# Patient Record
Sex: Female | Born: 1960
Health system: Southern US, Community
[De-identification: ages and names within clinical notes are randomized; demographics above are authoritative.]

## PROBLEM LIST (undated history)

## (undated) DIAGNOSIS — N840 Polyp of corpus uteri: Secondary | ICD-10-CM

## (undated) DIAGNOSIS — C439 Malignant melanoma of skin, unspecified: Secondary | ICD-10-CM

## (undated) DIAGNOSIS — G039 Meningitis, unspecified: Secondary | ICD-10-CM

## (undated) DIAGNOSIS — R7303 Prediabetes: Secondary | ICD-10-CM

## (undated) DIAGNOSIS — M1990 Unspecified osteoarthritis, unspecified site: Secondary | ICD-10-CM

## (undated) DIAGNOSIS — K219 Gastro-esophageal reflux disease without esophagitis: Secondary | ICD-10-CM

## (undated) DIAGNOSIS — G4730 Sleep apnea, unspecified: Secondary | ICD-10-CM

## (undated) HISTORY — DX: Unspecified osteoarthritis, unspecified site: M19.90

## (undated) HISTORY — PX: CHOLECYSTECTOMY: SHX55

## (undated) HISTORY — PX: OTHER SURGICAL HISTORY: SHX169

## (undated) HISTORY — PX: TONSILLECTOMY: SUR1361

## (undated) HISTORY — PX: DILATION AND CURETTAGE OF UTERUS: SHX78

## (undated) HISTORY — DX: Meningitis, unspecified: G03.9

## (undated) HISTORY — DX: Malignant melanoma of skin, unspecified: C43.9

---
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
## 1996-01-16 DIAGNOSIS — C439 Malignant melanoma of skin, unspecified: Secondary | ICD-10-CM

## 1996-01-16 HISTORY — DX: Malignant melanoma of skin, unspecified: C43.9

## 2002-02-25 DIAGNOSIS — Z85820 Personal history of malignant melanoma of skin: Secondary | ICD-10-CM | POA: Insufficient documentation

## 2011-06-08 ENCOUNTER — Emergency Department (HOSPITAL_COMMUNITY)
Admission: EM | Admit: 2011-06-08 | Discharge: 2011-06-08 | Disposition: A | Discharge status: Home / Self Care | Payer: PRIVATE HEALTH INSURANCE | Attending: Emergency Medicine | Admitting: Emergency Medicine

## 2011-06-08 ENCOUNTER — Encounter (HOSPITAL_COMMUNITY): Payer: Self-pay

## 2011-06-08 ENCOUNTER — Encounter (HOSPITAL_COMMUNITY): Payer: Self-pay | Admitting: Physical Medicine and Rehabilitation

## 2011-06-08 ENCOUNTER — Emergency Department (HOSPITAL_COMMUNITY)
Admission: EM | Admit: 2011-06-08 | Discharge: 2011-06-08 | Disposition: A | Discharge status: Other Acute Inpatient Hospital | Payer: PRIVATE HEALTH INSURANCE | Source: Home / Self Care | Attending: Family Medicine | Admitting: Family Medicine

## 2011-06-08 DIAGNOSIS — H43399 Other vitreous opacities, unspecified eye: Secondary | ICD-10-CM

## 2011-06-08 DIAGNOSIS — H43819 Vitreous degeneration, unspecified eye: Secondary | ICD-10-CM | POA: Insufficient documentation

## 2011-06-08 DIAGNOSIS — K219 Gastro-esophageal reflux disease without esophagitis: Secondary | ICD-10-CM | POA: Insufficient documentation

## 2011-06-08 DIAGNOSIS — H43812 Vitreous degeneration, left eye: Secondary | ICD-10-CM

## 2011-06-08 DIAGNOSIS — H43392 Other vitreous opacities, left eye: Secondary | ICD-10-CM

## 2011-06-08 HISTORY — DX: Gastro-esophageal reflux disease without esophagitis: K21.9

## 2011-06-08 MED ORDER — TETRACAINE HCL 0.5 % OP SOLN
2.0000 [drp] | Freq: Once | OPHTHALMIC | Status: AC
  Administered 2011-06-08: 2 [drp] via OPHTHALMIC

## 2011-06-08 MED ORDER — TETRACAINE HCL 0.5 % OP SOLN
OPHTHALMIC | Status: AC
  Filled 2011-06-08: qty 2

## 2011-06-08 NOTE — ED Notes (Signed)
Family at bedside. 

## 2011-06-08 NOTE — ED Notes (Signed)
MD at bedside. 

## 2011-06-08 NOTE — ED Notes (Signed)
Pt & family informed of plan of care, pt offered meal & drinks while waiting for attending, will continue to monitor

## 2011-06-08 NOTE — ED Provider Notes (Signed)
History     CSN: [id number]  Arrival date & time 06/08/11  1637   First MD Initiated Contact with Patient 06/08/11 1704      Chief Complaint  Patient presents with  . Eye Problem    (Consider location/radiation/quality/duration/timing/severity/associated sxs/prior treatment) HPI Comments: 51 y/o non diabetic female here c/o left eye scotomas and flash lights for 2 days. Worsening symptoms today, denies eye pain. Had a fall with head injury 3 days before eye symptoms. Patient has been evaluated for same complaints  At ED in [place], [place] and as per her reports had normal head CT. Denies dizziness, headache, balance problems, weakness or numbness. No nausea or vomiting.     Past Medical History  Diagnosis Date  . GERD (gastroesophageal reflux disease)     Past Surgical History  Procedure Date  . Tonsillectomy     History reviewed. No pertinent family history.  History  Substance Use Topics  . Smoking status: Never Smoker   . Smokeless tobacco: Not on file  . Alcohol Use: No    OB History    Grav Para Term Preterm Abortions TAB SAB Ect Mult Living                  Review of Systems  Eyes: Positive for visual disturbance. Negative for photophobia, pain, redness and itching.  Respiratory: Negative for shortness of breath.   Cardiovascular: Negative for palpitations.  Neurological: Negative for dizziness, seizures, speech difficulty, weakness, numbness and headaches.    Allergies  Other  Home Medications   Current Outpatient Rx  Name Route Sig Dispense Refill  . OMEPRAZOLE 10 MG PO CPDR Oral Take 10 mg by mouth daily.      BP 146/92  Pulse 99  Temp(Src) 98.1 F (36.7 C) (Oral)  Resp 10  SpO2 99%  Physical Exam  Nursing note and vitals reviewed. Constitutional: She is oriented to person, place, and time. She appears well-developed and well-nourished. No distress.  HENT:  Head: Normocephalic and atraumatic.  Eyes: Conjunctivae and EOM are normal. Pupils are  equal, round, and reactive to light.       20/30 visual acuity bilaterally, normal visual fields by comparison, impress normal light reflex. Impress a dark spot in right lower quadrant and on funduscopic examination although patient was not dilated. No obvious folds or detachments on limited fundoscopic exam.  Neck: Normal range of motion. Neck supple.  Cardiovascular: Normal rate, regular rhythm and normal heart sounds.   Pulmonary/Chest: Breath sounds normal.  Neurological: She is alert and oriented to person, place, and time.    ED Course  Procedures (including critical care time)  Labs Reviewed - No data to display No results found.   1. Flashers or floaters, left eye       MDM  51 y/o female here c/o left eye scotomas and flash lights for 2 days. On exam 20/30 visual acuity bilaterally, normal visual fields by comparison, impress normal light reflex. Impress a dark spot in right lower quadrant and on funduscopic examination although patient was not dilated. No obvious folds or detachments on limited fundoscopic exam. Symptoms concerning for retinal detachment. Case discussed with Dr. Gwen Pounds who will evaluate patient in the emergency department. Patient was transferred in stable condition via shuttle.         Sharin Grave, MD 06/09/11 1055

## 2011-06-08 NOTE — ED Provider Notes (Signed)
History     CSN: [id number]  Arrival date & time 06/08/11  4098   First MD Initiated Contact with Patient 06/08/11 1835      Chief Complaint  Patient presents with  . Eye Problem    (Consider location/radiation/quality/duration/timing/severity/associated sxs/prior treatment) HPI Comments: Flashes of light and floaters in L eye since falling several days ago.  No pain.  Otherwise doing well.  Patient is a 51 y.o. female presenting with eye problem. The history is provided by the patient.  Eye Problem  This is a new problem. Episode onset: 2 days. The problem occurs constantly. The problem has been gradually worsening. There is pain in the left eye. Injury mechanism: fell several days ago. The patient is experiencing no pain. There is no history of trauma to the eye. Pertinent negatives include no numbness.    Past Medical History  Diagnosis Date  . GERD (gastroesophageal reflux disease)     Past Surgical History  Procedure Date  . Tonsillectomy     No family history on file.  History  Substance Use Topics  . Smoking status: Never Smoker   . Smokeless tobacco: Not on file  . Alcohol Use: No    OB History    Grav Para Term Preterm Abortions TAB SAB Ect Mult Living                  Review of Systems  Constitutional: Negative for activity change.  HENT: Negative for congestion.   Eyes: Negative for visual disturbance.  Respiratory: Negative for chest tightness and shortness of breath.   Cardiovascular: Negative for chest pain and leg swelling.  Gastrointestinal: Negative for abdominal pain.  Genitourinary: Negative for dysuria.  Skin: Negative for rash.  Neurological: Negative for syncope and numbness.  Psychiatric/Behavioral: Negative for behavioral problems.    Allergies  Other  Home Medications   Current Outpatient Rx  Name Route Sig Dispense Refill  . OMEPRAZOLE 10 MG PO CPDR Oral Take 10 mg by mouth daily.      BP 148/106  Pulse 89  Temp(Src)  98.2 F (36.8 C) (Oral)  Resp 16  SpO2 97%  Physical Exam  Constitutional: She is oriented to person, place, and time. She appears well-developed and well-nourished.  HENT:  Head: Normocephalic and atraumatic.  Eyes: Conjunctivae and EOM are normal. Pupils are equal, round, and reactive to light. No scleral icterus.       Visual fields intact.  Pupils responsive.  EOM intact.  Neck: Normal range of motion. Neck supple.  Pulmonary/Chest: Effort normal and breath sounds normal. No respiratory distress.  Abdominal: Soft. She exhibits no distension and no mass. There is no tenderness. There is no rebound and no guarding.  Musculoskeletal: Normal range of motion.  Neurological: She is alert and oriented to person, place, and time. She has normal reflexes. No cranial nerve deficit.  Skin: Skin is warm and dry. No rash noted.  Psychiatric: She has a normal mood and affect. Her behavior is normal. Judgment and thought content normal.    ED Course  Procedures (including critical care time)  Labs Reviewed - No data to display No results found.   1. Posterior vitreous detachment of left eye       MDM  Flashes of light and floaters in L eye since falling several days ago.  No pain.  Otherwise doing well.  VSS and well appearing.  Ophtho consulted and has seen.  Ophtho exam c/w post vit detachment.  Will see  in f/u.  Pt comfortable with plan and will follow up.         Army Chaco, MD 06/08/11 2219
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
## 2011-06-08 NOTE — ED Notes (Signed)
Dr. Kowalski at bedside 

## 2011-06-08 NOTE — ED Notes (Signed)
Pt presents to department for evaluation of L eye problems. States she is seeing "flashes of light" and "black floaters" out of L eye. Onset Wednesday and has progressed since. Denies pain. Pt states "my vision out of my L eye is foggy." pt states that she fell x3 days before symptoms started. Pt is conscious alert and oriented x4.

## 2011-06-08 NOTE — Discharge Instructions (Signed)
Eye Floaters A jelly-like fluid fills the inside of the eye and is called the vitreous. The vitreous is normally clear. It allows light to pass through to the back of the eye to the tissues that contain the nerves needed for vision (the retina). With age, the vitreous can start to decline. If a decline happens, specks of material from clumps of cells, blood, or other materials may start to float around inside the eye. These objects cast shadows on the retina. These shadows are seen as moving strings, streaks, "bugs," dust or spider webs floating in front of the eye. CAUSES   Age.   A high degree of near-sightedness (high myopia).   Tears in the retina.   Bleeding inside the eye from broken retinal blood vessels as a result of disease (diabetes, inflammation of the retinal blood vessels, and others).   Blood clot of the major vein of the retina or its branches (retinal vein occlusion).   Trauma.   Retinal detachment.   Vitreous detachment.   Eye surgery.   Inflammation inside the eye (uveitis).   Infection inside the eye.  SYMPTOMS   Seeing floating specs, dots or spider webs in the vision of one eye. This can sometimes be associated with flashes of light seen off to the side.   Bleeding in the eye may begin as floaters and lead to complete vision loss as the vitreous fills with blood. This may happen repeatedly in certain diseases of the blood vessels of the retina (e.g. diabetes).   If the vitreous shrinks enough to pull away from the retina (posterior vitreous detachment), a small circular ring-shaped floater may be seen.  Migraine headaches may be associated with many forms of visual symptoms (sparkling dots, wavy lines) just before the headache strikes. These symptoms due to migraine are not from floaters. They will disappear when the headache goes away. DIAGNOSIS  An eye professional can tell you if you have floaters during an eye exam. TREATMENT  There is no treatment for  the floaters themselves.  If the floaters are due to a tear in the retina, a retinal detachment or other eye disease, the condition that caused the floaters must be treated.   Floaters due to blood in the eye often go away or lessen with time.  SEEK MEDICAL CARE IF:   You suddenly see floating dots or spider webs in front of the vision of one or both eyes. This is especially true if you also see flashes of light off to the side (like flashes of lightening).   You see floaters and also notice a change or drop in your vision in either eye.  Document Released: 01/04/2003 Document Revised: 12/21/2010 Document Reviewed: 05/01/2007 [facility] Patient Information 2012 [place], [place].Vitreous Detachment Most of the eye's interior is filled with vitreous. This is a clear, gel-like substance that helps the eye maintain a round shape. There are millions of fine fibers intertwined within the vitreous that are attached to the surface of the retina (eye's light-sensitive tissue). As we age, the vitreous slowly shrinks, and these fine fibers pull on the retinal surface. Usually the fibers break, allowing the vitreous to separate and shrink away from the retina. This is called a vitreous detachment. In most cases, a vitreous detachment is not sight-threatening. It requires no treatment. A vitreous detachment is a common condition that usually affects people over age 39. It is even more common after age 61. People who are nearsighted are also at increased risk. Those who  have a vitreous detachment in one eye are likely to have one in the other, but it may not happen until years later. SYMPTOMS  As the vitreous shrinks, it becomes somewhat stringy. These strands can cast tiny shadows on the retina. They are seen as "cobwebs" or specks that seem to float about in your field of vision (floaters). If you try to look at these shadows, they appear to quickly dart out of the way. One symptom of a vitreous detachment is a  small, sudden increase in the number of new floaters. This increase in floaters may be accompanied by flashes of light (lightning streaks) in your side (peripheral) vision. In most cases, either you will not notice a vitreous detachment, or you will find it merely annoying due to the increase in floaters. If the vitreous pulls away from the very back of the eye where it is attached to the optic nerve, you may see a small round circle floating within your visual field. This is from the ring of fiber that held the vitreous to the edges of the optic nerve, but has been pulled away. TREATMENT  A vitreous detachment itself does not threaten sight. Occasionally, however, some of the vitreous fibers pull so hard on the retina that they create a hole or tear in the retina. This may lead to a serious condition called a retinal detachment. If the hole occurs at the very back of the eye where all of the light for detailed vision is focused (macula), the ability to see detail clearly may be lost. Both of these conditions are sight-threatening and should be evaluated immediately.  DIAGNOSIS  If the vitreous detachment has led to a retinal tear, hole, or a detached retina, early treatment can help prevent loss of vision. However, macular holes may not be treatable. If left untreated, a detached retina can lead to permanent vision loss in the affected eye. Those who experience a sudden increase in floaters or an increase in flashes of light in peripheral vision should have an eye care specialist examine their eyes as soon as possible. If a portion of your visual field in one eye is completely black, like a curtain, you may have had a retinal detachment. The only way to diagnose the cause of the problem is to have a complete dilated eye exam (medicines are put in the eye to make the black circle, the pupil, larger) by an eye specialist. Document Released: 01/04/2003 Document Revised: 12/21/2010 Document Reviewed:  11/22/2008 [facility] Patient Information 2012 [place], [place].

## 2011-06-08 NOTE — ED Notes (Addendum)
C/o flashes of light and multiple new large black floaters in left eye; was reportedly seen at ED in [place] and referred to opthalmologic provider unavailable until Tuesday. ; denies pain ; Dr Tressia Danas advised

## 2011-06-08 NOTE — ED Provider Notes (Signed)
I saw and evaluated the patient, reviewed the resident's note and I agree with the findings and plan.  Pt seen by Dr. Okey Regal- he has performed dilated retina exam and recommends f/u with retinal specialist  Ethelda Chick, MD 06/08/11 2239

## 2011-06-09 ENCOUNTER — Emergency Department (HOSPITAL_COMMUNITY)
Admission: EM | Admit: 2011-06-09 | Discharge: 2011-06-09 | Disposition: A | Discharge status: Home / Self Care | Payer: PRIVATE HEALTH INSURANCE | Attending: Emergency Medicine | Admitting: Emergency Medicine

## 2011-06-09 ENCOUNTER — Encounter (HOSPITAL_COMMUNITY): Payer: Self-pay

## 2011-06-09 DIAGNOSIS — K219 Gastro-esophageal reflux disease without esophagitis: Secondary | ICD-10-CM | POA: Insufficient documentation

## 2011-06-09 DIAGNOSIS — H43819 Vitreous degeneration, unspecified eye: Secondary | ICD-10-CM | POA: Insufficient documentation

## 2011-06-09 DIAGNOSIS — H43812 Vitreous degeneration, left eye: Secondary | ICD-10-CM

## 2011-06-09 MED ORDER — ACETAMINOPHEN 325 MG PO TABS
650.0000 mg | ORAL_TABLET | Freq: Once | ORAL | Status: AC
  Administered 2011-06-09: 650 mg via ORAL
  Filled 2011-06-09: qty 2

## 2011-06-09 NOTE — ED Notes (Signed)
Reviewed discharge insrtuctions with patient and MD's.  Patient voiced understanding

## 2011-06-09 NOTE — ED Notes (Signed)
Patient denies pain and is resting comfortably.  

## 2011-06-09 NOTE — ED Notes (Signed)
Ambulated around the nurses station and stated that she felt off balance.  Amb by self with staff at her side.

## 2011-06-09 NOTE — ED Notes (Signed)
Pt. Was diagnosed with a detached vitreous of the lt Eye, pt. Was informed that if the eye became worse, to come back.  ABout 1 hour ago the eye went foggy denies any pain   Pt. Also reports that the pupil changed also became larger

## 2011-06-09 NOTE — Discharge Instructions (Signed)
Vitreous Detachment Most of the eye's interior is filled with vitreous. This is a clear, gel-like substance that helps the eye maintain a round shape. There are millions of fine fibers intertwined within the vitreous that are attached to the surface of the retina (eye's light-sensitive tissue). As we age, the vitreous slowly shrinks, and these fine fibers pull on the retinal surface. Usually the fibers break, allowing the vitreous to separate and shrink away from the retina. This is called a vitreous detachment. In most cases, a vitreous detachment is not sight-threatening. It requires no treatment. A vitreous detachment is a common condition that usually affects people over age 13. It is even more common after age 19. People who are nearsighted are also at increased risk. Those who have a vitreous detachment in one eye are likely to have one in the other, but it may not happen until years later. SYMPTOMS  As the vitreous shrinks, it becomes somewhat stringy. These strands can cast tiny shadows on the retina. They are seen as "cobwebs" or specks that seem to float about in your field of vision (floaters). If you try to look at these shadows, they appear to quickly dart out of the way. One symptom of a vitreous detachment is a small, sudden increase in the number of new floaters. This increase in floaters may be accompanied by flashes of light (lightning streaks) in your side (peripheral) vision. In most cases, either you will not notice a vitreous detachment, or you will find it merely annoying due to the increase in floaters. If the vitreous pulls away from the very back of the eye where it is attached to the optic nerve, you may see a small round circle floating within your visual field. This is from the ring of fiber that held the vitreous to the edges of the optic nerve, but has been pulled away. TREATMENT  A vitreous detachment itself does not threaten sight. Occasionally, however, some of the vitreous  fibers pull so hard on the retina that they create a hole or tear in the retina. This may lead to a serious condition called a retinal detachment. If the hole occurs at the very back of the eye where all of the light for detailed vision is focused (macula), the ability to see detail clearly may be lost. Both of these conditions are sight-threatening and should be evaluated immediately.  DIAGNOSIS  If the vitreous detachment has led to a retinal tear, hole, or a detached retina, early treatment can help prevent loss of vision. However, macular holes may not be treatable. If left untreated, a detached retina can lead to permanent vision loss in the affected eye. Those who experience a sudden increase in floaters or an increase in flashes of light in peripheral vision should have an eye care specialist examine their eyes as soon as possible. If a portion of your visual field in one eye is completely black, like a curtain, you may have had a retinal detachment. The only way to diagnose the cause of the problem is to have a complete dilated eye exam (medicines are put in the eye to make the black circle, the pupil, larger) by an eye specialist. Document Released: 01/04/2003 Document Revised: 12/21/2010 Document Reviewed: 11/22/2008 [facility] Patient Information 2012 [place], [place].Eye Floaters A jelly-like fluid fills the inside of the eye and is called the vitreous. The vitreous is normally clear. It allows light to pass through to the back of the eye to the tissues that contain the  nerves needed for vision (the retina). With age, the vitreous can start to decline. If a decline happens, specks of material from clumps of cells, blood, or other materials may start to float around inside the eye. These objects cast shadows on the retina. These shadows are seen as moving strings, streaks, "bugs," dust or spider webs floating in front of the eye. CAUSES   Age.   A high degree of near-sightedness (high myopia).    Tears in the retina.   Bleeding inside the eye from broken retinal blood vessels as a result of disease (diabetes, inflammation of the retinal blood vessels, and others).   Blood clot of the major vein of the retina or its branches (retinal vein occlusion).   Trauma.   Retinal detachment.   Vitreous detachment.   Eye surgery.   Inflammation inside the eye (uveitis).   Infection inside the eye.  SYMPTOMS   Seeing floating specs, dots or spider webs in the vision of one eye. This can sometimes be associated with flashes of light seen off to the side.   Bleeding in the eye may begin as floaters and lead to complete vision loss as the vitreous fills with blood. This may happen repeatedly in certain diseases of the blood vessels of the retina (e.g. diabetes).   If the vitreous shrinks enough to pull away from the retina (posterior vitreous detachment), a small circular ring-shaped floater may be seen.  Migraine headaches may be associated with many forms of visual symptoms (sparkling dots, wavy lines) just before the headache strikes. These symptoms due to migraine are not from floaters. They will disappear when the headache goes away. DIAGNOSIS  An eye professional can tell you if you have floaters during an eye exam. TREATMENT  There is no treatment for the floaters themselves.  If the floaters are due to a tear in the retina, a retinal detachment or other eye disease, the condition that caused the floaters must be treated.   Floaters due to blood in the eye often go away or lessen with time.  SEEK MEDICAL CARE IF:   You suddenly see floating dots or spider webs in front of the vision of one or both eyes. This is especially true if you also see flashes of light off to the side (like flashes of lightening).   You see floaters and also notice a change or drop in your vision in either eye.  Document Released: 01/04/2003 Document Revised: 12/21/2010 Document Reviewed:  05/01/2007 [facility] Patient Information 2012 [place], [place].

## 2011-06-09 NOTE — ED Provider Notes (Signed)
History     CSN: [id number]  Arrival date & time 06/09/11  1755   First MD Initiated Contact with Patient 06/09/11 1812      Chief Complaint  Patient presents with  . Eye Problem    (Consider location/radiation/quality/duration/timing/severity/associated sxs/prior treatment) HPI Comments: Seen in ED yesterday and diagnosed with posterior vitreous detachment.  Seen by ophtho yesterday and told to return if symptoms worsened.  Today had increased fogginess of vision and per the husband her left pupil was dilated at that time.  Dilation now resolved but pt says her vision is worse than it was yesterday.    Patient is a 51 y.o. female presenting with eye problem. The history is provided by the patient.  Eye Problem  This is a new problem. Episode onset: 2 days ago. The problem occurs constantly. The problem has been gradually worsening. There is pain in the left eye. There was no injury mechanism. The patient is experiencing no pain. There is no history of trauma to the eye.    Past Medical History  Diagnosis Date  . GERD (gastroesophageal reflux disease)     Past Surgical History  Procedure Date  . Tonsillectomy     No family history on file.  History  Substance Use Topics  . Smoking status: Never Smoker   . Smokeless tobacco: Not on file  . Alcohol Use: No    OB History    Grav Para Term Preterm Abortions TAB SAB Ect Mult Living                  Review of Systems  Constitutional: Negative for activity change.  HENT: Negative for congestion.   Eyes: Negative for visual disturbance.  Respiratory: Negative for chest tightness and shortness of breath.   Cardiovascular: Negative for chest pain and leg swelling.  Gastrointestinal: Negative for abdominal pain.  Genitourinary: Negative for dysuria.  Skin: Negative for rash.  Neurological: Negative for syncope.  Psychiatric/Behavioral: Negative for behavioral problems.    Allergies  Etodolac  Home Medications    Current Outpatient Rx  Name Route Sig Dispense Refill  . FLUTICASONE PROPIONATE 50 MCG/ACT NA SUSP Nasal Place 1 spray into the nose daily.    Marland Kitchen OMEPRAZOLE 10 MG PO CPDR Oral Take 10 mg by mouth daily.      BP 131/90  Pulse 86  Temp 98.3 F (36.8 C)  Resp 20  SpO2 98%  Physical Exam  Constitutional: She is oriented to person, place, and time. She appears well-developed and well-nourished.  HENT:  Head: Normocephalic and atraumatic.  Eyes: Conjunctivae and EOM are normal. Pupils are equal, round, and reactive to light. Right eye exhibits no discharge. Left eye exhibits no discharge. No scleral icterus.       L 20/40  R 20/30.  All visual fields intact in L eye.  Neck: Normal range of motion. Neck supple.  Cardiovascular: Normal rate and regular rhythm.  Exam reveals no gallop and no friction rub.   No murmur heard. Pulmonary/Chest: Effort normal and breath sounds normal. No respiratory distress. She has no wheezes. She has no rales. She exhibits no tenderness.  Abdominal: Soft. She exhibits no distension and no mass. There is no tenderness. There is no rebound and no guarding.  Musculoskeletal: Normal range of motion.  Neurological: She is alert and oriented to person, place, and time. She has normal reflexes. No cranial nerve deficit.  Skin: Skin is warm and dry. No rash noted.  Psychiatric: She has  a normal mood and affect. Her behavior is normal. Judgment and thought content normal.    ED Course  Procedures (including critical care time)  Labs Reviewed - No data to display No results found.   1. Posterior vitreous detachment of left eye       MDM  Seen in ED yesterday and diagnosed with posterior vitreous detachment.  Seen by ophtho yesterday and told to return if symptoms worsened.  Today had increased fogginess of vision and per the husband her left pupil was dilated at that time.  Dilation now resolved but pt says her vision is worse than it was yesterday.  VSS and  well appearing.  Eye exam without significant change form exam yesterday.  Pupils normal in ED.  D/w ophtho - Dr. Waldron Labs who saw pt yesterday.  Felt pupil difference could be 2/2 dilatation yesterday.  Plan to f/u as scheduled yesterday.  Low suspicion for new retinal detachment at this time.  While in ED pt developed a mild headache and said she felt "woozy". Ambulatory in ED without difficulty.  No focal neuro deficits.  Gave tylenol.  Plan for ophtho f/u as scheduled.  Pt comfortable with plan and will follow up.        Army Chaco, MD 06/09/11 1949

## 2011-06-09 NOTE — ED Notes (Signed)
Family at bedside. 

## 2011-06-10 NOTE — ED Provider Notes (Signed)
I saw the patient, reviewed the resident's note and I agree with the findings and plan.   .Face to face Exam:  General:  Awake HEENT:  Atraumatic Resp:  Normal effort Neuro:No focal weakness   Nelia Shi, MD 06/10/11 1005

## 2011-10-07 ENCOUNTER — Encounter (HOSPITAL_COMMUNITY): Payer: Self-pay | Admitting: *Deleted

## 2011-10-07 ENCOUNTER — Emergency Department (HOSPITAL_COMMUNITY)
Admission: EM | Admit: 2011-10-07 | Discharge: 2011-10-08 | Disposition: A | Discharge status: Home / Self Care | Payer: No Typology Code available for payment source | Attending: Emergency Medicine | Admitting: Emergency Medicine

## 2011-10-07 DIAGNOSIS — Z888 Allergy status to other drugs, medicaments and biological substances status: Secondary | ICD-10-CM | POA: Insufficient documentation

## 2011-10-07 DIAGNOSIS — H539 Unspecified visual disturbance: Secondary | ICD-10-CM

## 2011-10-07 DIAGNOSIS — K219 Gastro-esophageal reflux disease without esophagitis: Secondary | ICD-10-CM | POA: Insufficient documentation

## 2011-10-07 DIAGNOSIS — H5710 Ocular pain, unspecified eye: Secondary | ICD-10-CM | POA: Insufficient documentation

## 2011-10-07 DIAGNOSIS — H5712 Ocular pain, left eye: Secondary | ICD-10-CM

## 2011-10-07 NOTE — ED Notes (Signed)
The pt had a retinal tear. In her lt eye and had lacer surgery July 20th.  She started having floaters this afternoon

## 2011-10-07 NOTE — ED Notes (Signed)
Patient says she first experience posterior vitreous detachment and a retinal tear. Patient says she had surgery in that eye and the MD prescribed eye drops that dilate her left eye.  She says she has chronic "floaters" but what brings her in today is she experienced "floaters" plus "flashes" in her peripheral vision.  Patient does have a letter from a Adline Mango, MD, Department of Opthalmology in [facility], [place], [place].

## 2011-10-08 NOTE — ED Provider Notes (Signed)
History     CSN: [id number]  Arrival date & time 10/07/11  2028   First MD Initiated Contact with Patient 10/07/11 2256      Chief Complaint  Patient presents with  . Eye Problem    (Consider location/radiation/quality/duration/timing/severity/associated sxs/prior treatment) HPI 51 year old female presents to emergency room complaining of left eye pain, increased floaters and the perception of a blue light in the periphery of her left eye since yesterday afternoon. Patient with history of vitreous detachment as well as retinal detachment, repaired in July. Patient is from [place], had repair done there. Also has history of uveitis. She reports she began having dull pain with some blurred vision some weeks ago, her eye doctor had called in Pred Forte and Cyclogyl for her, which she has been using. She reports symptoms improved and she had weaned herself off of these medicines, but then began using again yesterday. Patient was told she had a retinal bleed after the surgery, and was told by her eye doctor if she had any changes in her vision, she should go directly to the emergency department. Patient is planning to return to [place] tomorrow, plans on driving with her husband. She or expects to be home in [place] Friday. She or describes vision out of her left eye is slightly blurred. She has dull pain to the eye   Past Medical History  Diagnosis Date  . GERD (gastroesophageal reflux disease)     Past Surgical History  Procedure Date  . Tonsillectomy     No family history on file.  History  Substance Use Topics  . Smoking status: Never Smoker   . Smokeless tobacco: Not on file  . Alcohol Use: No    OB History    Grav Para Term Preterm Abortions TAB SAB Ect Mult Living                  Review of Systems  All other systems reviewed and are negative.    Allergies  Etodolac  Home Medications   Current Outpatient Rx  Name Route Sig Dispense Refill  .  CYCLOPENTOLATE HCL 1 % OP SOLN Both Eyes Place 1 drop into both eyes 3 (three) times daily.    Marland Kitchen FLUTICASONE PROPIONATE 50 MCG/ACT NA SUSP Nasal Place 1 spray into the nose daily as needed. For congestion    . OMEPRAZOLE 10 MG PO CPDR Oral Take 10 mg by mouth 2 (two) times daily.     Marland Kitchen PREDNISOLONE ACETATE 1 % OP SUSP Left Eye Place 1 drop into the left eye 3 (three) times daily.      BP 132/82  Pulse 84  Temp 97.6 F (36.4 C) (Oral)  Resp 18  SpO2 95%  Physical Exam  Nursing note and vitals reviewed. Constitutional: She appears distressed (uncomfortable appearing).  Eyes: Conjunctivae normal and lids are normal. No foreign bodies found. Right eye exhibits no chemosis, no discharge, no exudate and no hordeolum. No foreign body present in the right eye. Left eye exhibits no chemosis, no discharge, no exudate and no hordeolum. No foreign body present in the left eye. Right conjunctiva is not injected. Right conjunctiva has no hemorrhage. Left conjunctiva is not injected. Left conjunctiva has no hemorrhage. No scleral icterus. Right eye exhibits normal extraocular motion. Left eye exhibits normal extraocular motion. Right pupil is reactive. Left pupil is not reactive (left pupil is dilated and does not react to light, patient has been using cyclic gel.).  Slit lamp exam:  The right eye shows no anterior chamber bulge.       The left eye shows no hyphema, no hypopyon and no anterior chamber bulge.       Slit-lamp exam shows no cell or flare in either eye. Funduscopic exam shows surgical repair of left posterior segment, no acute changes noted. Bedside ultrasound was performed, no signs of retinal attachment seen on bedside ultrasound. Left eye was compared with the right and no changes between the 2.    ED Course  Procedures (including critical care time)  Labs Reviewed - No data to display No results found.   1. Left eye pain   2. Visual changes       MDM  51 year old female  with a prior history of vitreous detachment, retinal detachment, uveitis and left eye who presents with reported increased floaters, and change in vision. Visual acuity noted, significant decrease in the left eye. Discuss case with Dr. Luciana Axe, who requests patient be seen in the clinic at 8:30 tomorrow morning.  He recommends continuing current medications. Patient reports her insurance will not cover this visit, she thinks that she will probably go to [facility] tomorrow where [facility] has offices and try to find an eye MD I stressed to the patient that is important for her to have very close followup given her past eye history.        Olivia Mackie, MD 10/08/11 [phone]

## 2011-10-08 NOTE — ED Notes (Signed)
Patient is alert and orientedx4.  Patient was explained discharge instructions and she had no questions.  Patient is being taken home by Jilda Roche, her spouse.

## 2014-11-23 DIAGNOSIS — R7303 Prediabetes: Secondary | ICD-10-CM | POA: Insufficient documentation

## 2015-01-16 HISTORY — PX: CHOLECYSTECTOMY: SHX55

## 2015-03-23 DIAGNOSIS — M19041 Primary osteoarthritis, right hand: Secondary | ICD-10-CM | POA: Insufficient documentation

## 2015-06-15 DIAGNOSIS — G4733 Obstructive sleep apnea (adult) (pediatric): Secondary | ICD-10-CM | POA: Insufficient documentation

## 2016-12-01 ENCOUNTER — Emergency Department (HOSPITAL_COMMUNITY)
Admission: EM | Admit: 2016-12-01 | Discharge: 2016-12-01 | Disposition: A | Discharge status: Home / Self Care | Payer: PRIVATE HEALTH INSURANCE | Attending: Emergency Medicine | Admitting: Emergency Medicine

## 2016-12-01 ENCOUNTER — Emergency Department (HOSPITAL_COMMUNITY): Payer: PRIVATE HEALTH INSURANCE

## 2016-12-01 ENCOUNTER — Other Ambulatory Visit: Payer: Self-pay

## 2016-12-01 ENCOUNTER — Encounter (HOSPITAL_COMMUNITY): Payer: Self-pay

## 2016-12-01 DIAGNOSIS — Z79899 Other long term (current) drug therapy: Secondary | ICD-10-CM | POA: Diagnosis not present

## 2016-12-01 DIAGNOSIS — R42 Dizziness and giddiness: Secondary | ICD-10-CM | POA: Diagnosis not present

## 2016-12-01 DIAGNOSIS — R002 Palpitations: Secondary | ICD-10-CM | POA: Insufficient documentation

## 2016-12-01 DIAGNOSIS — N858 Other specified noninflammatory disorders of uterus: Secondary | ICD-10-CM | POA: Insufficient documentation

## 2016-12-01 DIAGNOSIS — R51 Headache: Secondary | ICD-10-CM | POA: Insufficient documentation

## 2016-12-01 DIAGNOSIS — N938 Other specified abnormal uterine and vaginal bleeding: Secondary | ICD-10-CM | POA: Diagnosis present

## 2016-12-01 LAB — CBC
HCT: 43.1 % (ref 36.0–46.0)
Hemoglobin: 14.6 g/dL (ref 12.0–15.0)
MCH: 30.2 pg (ref 26.0–34.0)
MCHC: 33.9 g/dL (ref 30.0–36.0)
MCV: 89 fL (ref 78.0–100.0)
PLATELETS: 309 10*3/uL (ref 150–400)
RBC: 4.84 MIL/uL (ref 3.87–5.11)
RDW: 13.2 % (ref 11.5–15.5)
WBC: 9.3 10*3/uL (ref 4.0–10.5)

## 2016-12-01 LAB — BASIC METABOLIC PANEL
Anion gap: 9 (ref 5–15)
BUN: 13 mg/dL (ref 6–20)
CALCIUM: 9.2 mg/dL (ref 8.9–10.3)
CO2: 26 mmol/L (ref 22–32)
Chloride: 104 mmol/L (ref 101–111)
Creatinine, Ser: 0.9 mg/dL (ref 0.44–1.00)
GFR calc Af Amer: >60 mL/min (ref >60)
GLUCOSE: 96 mg/dL (ref 65–99)
Potassium: 3.8 mmol/L (ref 3.5–5.1)
SODIUM: 139 mmol/L (ref 135–145)

## 2016-12-01 LAB — HEPATIC FUNCTION PANEL
ALT: 14 U/L (ref 14–54)
AST: 19 U/L (ref 15–41)
Albumin: 4 g/dL (ref 3.5–5.0)
Alkaline Phosphatase: 75 U/L (ref 38–126)
BILIRUBIN DIRECT: 0.1 mg/dL (ref 0.1–0.5)
BILIRUBIN INDIRECT: 0.7 mg/dL (ref 0.3–0.9)
Total Bilirubin: 0.8 mg/dL (ref 0.3–1.2)
Total Protein: 7.5 g/dL (ref 6.5–8.1)

## 2016-12-01 LAB — WET PREP, GENITAL
Clue Cells Wet Prep HPF POC: NONE SEEN
Sperm: NONE SEEN
Trich, Wet Prep: NONE SEEN
YEAST WET PREP: NONE SEEN

## 2016-12-01 LAB — I-STAT TROPONIN, ED: TROPONIN I, POC: 0 ng/mL (ref 0.00–0.08)

## 2016-12-01 LAB — APTT: aPTT: 34 seconds (ref 24–36)

## 2016-12-01 LAB — PROTIME-INR
INR: 1.03
PROTHROMBIN TIME: 13.4 s (ref 11.4–15.2)

## 2016-12-01 MED ORDER — METOCLOPRAMIDE HCL 5 MG/ML IJ SOLN
10.0000 mg | Freq: Once | INTRAMUSCULAR | Status: AC
Start: 2016-12-01 — End: 2016-12-01
  Administered 2016-12-01: 10 mg via INTRAVENOUS
  Filled 2016-12-01: qty 2

## 2016-12-01 MED ORDER — METHYLPREDNISOLONE SODIUM SUCC 125 MG IJ SOLR
125.0000 mg | Freq: Once | INTRAMUSCULAR | Status: AC
  Administered 2016-12-01: 125 mg via INTRAVENOUS
  Filled 2016-12-01: qty 2

## 2016-12-01 MED ORDER — SODIUM CHLORIDE 0.9 % IV BOLUS (SEPSIS)
1000.0000 mL | Freq: Once | INTRAVENOUS | Status: AC
  Administered 2016-12-01: 1000 mL via INTRAVENOUS

## 2016-12-01 MED ORDER — ACETAMINOPHEN 500 MG PO TABS
1000.0000 mg | ORAL_TABLET | Freq: Once | ORAL | Status: AC
  Administered 2016-12-01: 1000 mg via ORAL
  Filled 2016-12-01: qty 2

## 2016-12-01 NOTE — ED Notes (Signed)
Pt unable to give urine sample at this time. Pt states it feels as if something is coming out when she tries to void; blood visible but nothing protruding from vagina at this time.

## 2016-12-01 NOTE — ED Notes (Signed)
Pt unable to provide urine sample at this time 

## 2016-12-01 NOTE — ED Notes (Signed)
Pelvic cart is at bedside patient is resting with call bell in reach

## 2016-12-01 NOTE — ED Provider Notes (Signed)
[place] EMERGENCY DEPARTMENT Provider Note   CSN: [id number] Arrival date & time: 12/01/16  0605     History   Chief Complaint Chief Complaint  Patient presents with  . Vaginal Bleeding  . Headache    HPI Tammie Gomez is a 56 y.o. female.  HPI   Tammie Gomez is a 56 y.o. female, with a history of GERD and HSV meningitis, presenting to the ED with headache for last 4-5 days.  Patient states headache is not only persistent, but today seems to be worsening.  Located left parietal and behind her eyes bilaterally.  Additionally has pain to the central occipital area.  This pain is worse with lying supine, returning to sitting position from standing, and with movement of her head.  Currently rates it 5-6/10, states it feels like a tightness, "like I have been grimacing or clenching my teeth for a long time."  Endorses ringing in the ears bilaterally as well as lightheadedness and nausea.  One episode of emesis yesterday morning. States she has a history of 2 episodes of meningitis, the most recent of which was last year and was found to be due to herpes simplex. States this current headache does not feel similar to the headache associated with that illness.   "Chest pain" noted in triage assessment was placed by intake RN because patient states she felt her heart "pounding" yesterday.  No current such symptoms.  Also complains of vaginal bleeding.  Around 4AM woke up with vaginal bleeding with clots. Two pads since onset. Has been postmenopausal for 8 years. Has never had abnormal Pap, but no paps in at least 8 years.  No local OB/GYN, states she recently moved from [place].  Minor lower abdominal discomfort starting last few hours. Denies sexual activity.   Denies fever/chills, neuro deficits, neck stiffness, chest pain, shortness of breath, abdominal pain, falls/trauma, vision loss, congestion, recent illness, or any other complaints.   Past Medical History:    Diagnosis Date  . GERD (gastroesophageal reflux disease)     There are no active problems to display for this patient.   Past Surgical History:  Procedure Laterality Date  . TONSILLECTOMY      OB History    No data available       Home Medications    Prior to Admission medications   Medication Sig Start Date End Date Taking? Authorizing Provider  diclofenac sodium (VOLTAREN) 1 % GEL Apply 1 application topically. 12/16/15 12/16/17 Yes [provider]  fluticasone (FLONASE) 50 MCG/ACT nasal spray Place 1 spray daily as needed into the nose for allergies. For congestion   Yes [provider]  naproxen sodium (ALEVE) 220 MG tablet Take 220 mg daily as needed by mouth (for pain).   Yes [provider]  omeprazole (PRILOSEC) 10 MG capsule Take 10 mg 2 (two) times daily as needed by mouth (for heartburn).    Yes [provider]    Family History No family history on file.  Social History Social History   Tobacco Use  . Smoking status: Never Smoker  . Smokeless tobacco: Never Used  Substance Use Topics  . Alcohol use: No  . Drug use: Not on file     Allergies   Etodolac   Review of Systems Review of Systems  Constitutional: Negative for chills, diaphoresis and fever.  Respiratory: Negative for shortness of breath.   Cardiovascular: Negative for chest pain.  Gastrointestinal: Positive for nausea and vomiting (one episode).  Genitourinary: Positive for vaginal bleeding. Negative for dysuria and frequency.  Neurological: Positive for light-headedness and headaches. Negative for syncope, weakness and numbness.  All other systems reviewed and are negative.    Physical Exam Updated Vital Signs BP (!) 137/95   Pulse 93   Temp 98.1 F (36.7 C)   Resp 18   Ht 5\' 4"  (1.626 m)   Wt 90.7 kg (200 lb)   SpO2 100%   BMI 34.33 kg/m   Physical Exam  Constitutional: She is oriented to person, place, and time. She appears  well-developed and well-nourished. No distress.  HENT:  Head: Normocephalic and atraumatic.  Eyes: Conjunctivae and EOM are normal. Pupils are equal, round, and reactive to light.  Neck: Normal range of motion. Neck supple.  Cardiovascular: Normal rate, regular rhythm, normal heart sounds and intact distal pulses.  Pulmonary/Chest: Effort normal and breath sounds normal. No respiratory distress.  Abdominal: Soft. There is no tenderness. There is no guarding.  Genitourinary: There is bleeding in the vagina.  Genitourinary Comments: External genitalia normal Vagina with discharge -some minimal blood pooling in vaginal vault Cervix - Not visualized due to large, tennis ball sized, firm, black mass sitting in the vaginal vault.  Mass is mobile within the vaginal vault, however, seems to be attached superiorly, possibly near the cervix. negative for cervical motion tenderness Adnexa palpated, no masses, negative for tenderness noted Bladder palpated negative for tenderness Uterus palpated no masses, negative for tenderness  No inguinal lymphadenopathy. Otherwise normal female genitalia. RN, Janett Billow, served as chaperone during exam.  Musculoskeletal: She exhibits no edema.  Normal motor function intact in all extremities and spine. No midline spinal tenderness.   Lymphadenopathy:    She has no cervical adenopathy.  Neurological: She is alert and oriented to person, place, and time.  No sensory deficits.  No noted speech deficits. No aphasia. Patient handles oral secretions without difficulty. No noted swallowing defects.  Equal grip strength bilaterally. Strength 5/5 in the upper extremities. Strength 5/5 with flexion and extension of the hips, knees, and ankles bilaterally.  Patellar DTRs 2+ bilaterally Negative Romberg. No gait disturbance.  Coordination intact including heel to shin and finger to nose.  Cranial nerves III-XII grossly intact.  No facial droop.   Skin: Skin is warm and  dry. Capillary refill takes less than 2 seconds. She is not diaphoretic.  Psychiatric: She has a normal mood and affect. Her behavior is normal.  Nursing note and vitals reviewed.    ED Treatments / Results  Labs (all labs ordered are listed, but only abnormal results are displayed) Labs Reviewed  WET PREP, GENITAL - Abnormal; Notable for the following components:      Result Value   WBC, Wet Prep HPF POC FEW (*)    All other components within normal limits  BASIC METABOLIC PANEL  CBC  HEPATIC FUNCTION PANEL  PROTIME-INR  APTT  RPR  HIV ANTIBODY (ROUTINE TESTING)  CA 125  I-STAT TROPONIN, ED  GC/CHLAMYDIA PROBE AMP ([place]) NOT AT [facility]    EKG  EKG Interpretation None       Radiology Dg Chest 2 View  Result Date: 12/01/2016 CLINICAL DATA:  Acute onset of vaginal bleeding. Headache and palpitations. Dizziness. EXAM: CHEST  2 VIEW COMPARISON:  None. FINDINGS: The lungs are well-aerated and clear. There is no evidence of focal opacification, pleural effusion or pneumothorax. The heart is normal in size; the mediastinal contour is within normal limits. No acute osseous abnormalities are seen.  IMPRESSION: No acute cardiopulmonary process seen. Electronically Signed   By: Garald Balding M.D.   On: 12/01/2016 06:41   Ct Head Wo Contrast  Result Date: 12/01/2016 CLINICAL DATA:  Headache and nausea. EXAM: CT HEAD WITHOUT CONTRAST TECHNIQUE: Contiguous axial images were obtained from the base of the skull through the vertex without intravenous contrast. COMPARISON:  None. FINDINGS: Brain: No evidence of acute infarction, hemorrhage, hydrocephalus, extra-axial collection or mass lesion/mass effect. Vascular: No hyperdense vessel or unexpected calcification. Skull: Normal. Negative for fracture or focal lesion. Sinuses/Orbits: No acute finding. Other: None. IMPRESSION: Normal noncontrast head CT. Electronically Signed   By: Titus Dubin M.D.   On: 12/01/2016 12:29   US Pelvis  Complete  Result Date: 12/01/2016 CLINICAL DATA:  Vaginal mass on physical exam EXAM: TRANSABDOMINAL ULTRASOUND OF PELVIS TECHNIQUE: Transabdominal ultrasound examination of the pelvis was performed including evaluation of the uterus, ovaries, adnexal regions, and pelvic cul-de-sac. COMPARISON:  None. FINDINGS: Uterus Measurements: 10.8 x 4.5 x 5.9 cm. Soft tissue mass is noted in the lower uterine segment extending into the vaginal vault consistent with the given clinical history. It measures approximately 3.3 x 5.3 x 4.8 cm. Endometrium Thickness: 7 mm.  No focal abnormality visualized. Right ovary Measurements: 3.3 x 2.5 x 2.7 cm. A 2.5 cm cyst is noted within the ovary. Left ovary Nonvisualized Other findings:  No abnormal free fluid. IMPRESSION: Mass in the lower uterine segment extending into the vaginal vault as described. This is consistent with the patient's given clinical history. 2.5 cm cyst in the right ovary. Given the patient's postmenopausal state, a yearly ultrasound is recommended to assess for stability. Electronically Signed   By: Inez Catalina M.D.   On: 12/01/2016 15:48    Procedures Pelvic exam Date/Time: 12/01/2016 1:01 PM Performed by: Lorayne Bender, PA-C Authorized by: Lorayne Bender, PA-C  Consent: Verbal consent obtained. Risks and benefits: risks, benefits and alternatives were discussed Consent given by: patient Patient identity confirmed: verbally with patient and provided demographic data Local anesthesia used: no  Anesthesia: Local anesthesia used: no  Sedation: Patient sedated: no  Patient tolerance: Patient tolerated the procedure well with no immediate complications    (including critical care time)  Medications Ordered in ED Medications  sodium chloride 0.9 % bolus 1,000 mL (0 mLs Intravenous Stopped 12/01/16 1330)  metoCLOPramide (REGLAN) injection 10 mg (10 mg Intravenous Given 12/01/16 1207)  methylPREDNISolone sodium succinate (SOLU-MEDROL) 125 mg/2  mL injection 125 mg (125 mg Intravenous Given 12/01/16 1206)  acetaminophen (TYLENOL) tablet 1,000 mg (1,000 mg Oral Given 12/01/16 1209)     Initial Impression / Assessment and Plan / ED Course  I have reviewed the triage vital signs and the nursing notes.  Pertinent labs & imaging results that were available during my care of the patient were reviewed by me and considered in my medical decision making (see chart for details).  Clinical Course as of Dec 01 1736  Sat Dec 01, 2016  9702 ED EKG within 10 minutes [KH]  1230 Discussed head CT results with patient. Headache has improved to 4/10.  [SJ]  1300 Patient states symptoms associated with her headache have resolved.   [SJ]  [place] with Dr. Ilda Basset, OBGYN. Requests a formal transabdominal ultrasound and then requests a call back with results.  [SJ]  6378 Discussed ultrasound results and plan of care with the patient.  Patient voices understanding and agrees to the plan. Patient continues to be pain-free.  [SJ]  Clinical Course User Index [KH] Honor Junes, Student-PA [SJ] Lorayne Bender, [place]    Patient presents with abnormal vaginal bleeding.  Vaginal mass noted on pelvic exam.  Noted to extend into the uterus on ultrasound.  Patient's headache resolved with conservative management.  No red flag symptoms.  No neuro or functional deficits.  OB/GYN follow-up.  Return precautions discussed.  Patient voices understanding of all instructions and is comfortable discharge.    Vitals:   12/01/16 1345 12/01/16 1400 12/01/16 1615 12/01/16 1645  BP: (!) 155/95 (!) 148/94 123/70 107/80  Pulse: 87 90 97 96  Resp: (!) 22 19 18  (!) 22  Temp:      SpO2: 95% 94% 95%   Weight:      Height:         Final Clinical Impressions(s) / ED Diagnoses   Final diagnoses:  Uterine mass    ED Discharge Orders    None       Layla Maw 12/01/16 1740    Carmin Muskrat, MD 12/06/16 8127    Carmin Muskrat, MD 12/06/16  [phone]

## 2016-12-01 NOTE — ED Triage Notes (Signed)
Pt states that she woke up last night with vaginal bleeding with clots, pt has been postmenopausal for 8 years. Pt also c/o of headache for three days and palpations with dizziness that just started

## 2016-12-01 NOTE — ED Notes (Signed)
Patient transported to Ultrasound 

## 2016-12-01 NOTE — Discharge Instructions (Signed)
There is a mass noted in the uterus on ultrasound.  This is not been able to be definitively identified yet.  It needs further investigation.  Please follow-up with the [facility] on this matter.  They should call you early next week to set up an appointment. If you haven't heard from them by Tuesday November 20, please call the clinic.   If you begin to have worsening vaginal bleeding, defined as soaking greater than 1 pad an hour, please proceed to the emergency department at [facility].

## 2016-12-01 NOTE — ED Provider Notes (Signed)
ECG w SR, rate 94, unremarkable.  On my exam the patient was awake and alert,   She is aware of all findings, including [place] results.  She will f/u w Candace Gallus, MD 12/01/16 [phone]

## 2016-12-01 NOTE — ED Notes (Signed)
Patient transported to CT 

## 2016-12-01 NOTE — ED Notes (Signed)
Got patient into a gown on the monitor patient is resting with call bell in reach 

## 2016-12-02 LAB — RPR: RPR: NONREACTIVE

## 2016-12-02 LAB — HIV ANTIBODY (ROUTINE TESTING W REFLEX): HIV Screen 4th Generation wRfx: NONREACTIVE

## 2016-12-02 LAB — CA 125: Cancer Antigen (CA) 125: 36.4 U/mL (ref 0.0–38.1)

## 2016-12-03 ENCOUNTER — Telehealth: Payer: Self-pay | Admitting: General Practice

## 2016-12-03 LAB — GC/CHLAMYDIA PROBE AMP (~~LOC~~) NOT AT ARMC
CHLAMYDIA, DNA PROBE: NEGATIVE
Neisseria Gonorrhea: NEGATIVE

## 2016-12-03 NOTE — Telephone Encounter (Signed)
Called and notified patient of follow up appointment on 12/05/16 at 9:40am with Dr. Rosana Hoes.  Per Dr. Ilda Basset, patient needed to be seen ASAP to f/u ER visit.  Patient verbalized understanding.

## 2016-12-05 ENCOUNTER — Other Ambulatory Visit: Payer: Self-pay | Admitting: Obstetrics and Gynecology

## 2016-12-05 ENCOUNTER — Encounter: Payer: Self-pay | Admitting: Obstetrics and Gynecology

## 2016-12-05 ENCOUNTER — Other Ambulatory Visit (HOSPITAL_COMMUNITY)
Admission: RE | Admit: 2016-12-05 | Discharge: 2016-12-05 | Disposition: A | Discharge status: Home / Self Care | Payer: 59 | Source: Ambulatory Visit | Attending: Obstetrics and Gynecology | Admitting: Obstetrics and Gynecology

## 2016-12-05 ENCOUNTER — Ambulatory Visit: Payer: 59 | Admitting: Obstetrics and Gynecology

## 2016-12-05 VITALS — BP 128/87 | HR 90 | Wt 205.0 lb

## 2016-12-05 DIAGNOSIS — N898 Other specified noninflammatory disorders of vagina: Secondary | ICD-10-CM

## 2016-12-05 DIAGNOSIS — N899 Noninflammatory disorder of vagina, unspecified: Secondary | ICD-10-CM

## 2016-12-05 DIAGNOSIS — N939 Abnormal uterine and vaginal bleeding, unspecified: Secondary | ICD-10-CM

## 2016-12-05 MED ORDER — NORETHINDRONE ACETATE 5 MG PO TABS: 5.0000 mg | ORAL_TABLET | Freq: Every day | ORAL | 0 refills | Status: DC

## 2016-12-05 NOTE — Progress Notes (Signed)
Pt stated still having heavy bleeding and went to ER 12-01-2016

## 2016-12-05 NOTE — Progress Notes (Signed)
GYNECOLOGY OFFICE VISIT NOTE  History:  56 y.o. [id number] here today for follow up from ED. She woke up in the middle fo the night with heavy bleeding and presented. They told her she had a mass, and sent her home. She is still having bleeding but it is much lighter. She is 8 years post-menopausal and has not had any vaginal bleeding since menopause.   Reports she is pre-diabetic, watches diet and doesn't eat carbs. Tests BG PP 120s. Fasting 110-115  Past Medical History:  Diagnosis Date  . GERD (gastroesophageal reflux disease)   . Melanoma ([place]) 1998   right leg, no chemo  . Meningitis     Past Surgical History:  Procedure Laterality Date  . TONSILLECTOMY       Current Outpatient Medications:  .  diclofenac sodium (VOLTAREN) 1 % GEL, Apply 1 application topically., Disp: , Rfl:  .  fluticasone (FLONASE) 50 MCG/ACT nasal spray, Place 1 spray daily as needed into the nose for allergies. For congestion, Disp: , Rfl:  .  naproxen sodium (ALEVE) 220 MG tablet, Take 220 mg daily as needed by mouth (for pain)., Disp: , Rfl:  .  omeprazole (PRILOSEC) 10 MG capsule, Take 10 mg 2 (two) times daily as needed by mouth (for heartburn). , Disp: , Rfl:   Family Hx: diabetes in dad's family No h/o reproductive cancers, no colon, prostate, stomach cancer  The following portions of the patient's history were reviewed and updated as appropriate: allergies, current medications, past family history, past medical history, past social history, past surgical history and problem list.   Health Maintenance:  Last pap: ~5 years ago, reports all normal Last mammogram: has never had one  Review of Systems:  Pertinent items noted in HPI and remainder of comprehensive ROS otherwise negative.   Objective:  Physical Exam BP 128/87   Pulse 90   Wt 205 lb (93 kg)   LMP 12/14/2008 (Approximate)   BMI 35.19 kg/m  CONSTITUTIONAL: Well-developed, well-nourished female in no acute distress, appears  anxious  HENT:  Normocephalic, atraumatic. External right and left ear normal. Oropharynx is clear and moist EYES: Conjunctivae and EOM are normal. Pupils are equal, round, and reactive to light. No scleral icterus.  NECK: Normal range of motion, supple, no masses SKIN: Skin is warm and dry. No rash noted. Not diaphoretic. No erythema. No pallor. NEUROLOGIC: Alert and oriented to person, place, and time. Normal reflexes, muscle tone coordination. No cranial nerve deficit noted. PSYCHIATRIC: Normal mood and affect. Normal behavior. Normal judgment and thought content. CARDIOVASCULAR: Normal heart rate noted RESPIRATORY: Effort and breath sounds normal, no problems with respiration noted ABDOMEN: Soft, no distention noted.   PELVIC: dark red blood at introitus. 4 cm mass with appearance of blood clot in vagina at apex, firmly implanted, unable to determine root of mass (unclear if cervical or uterine protruding through cervix), mass moveable, no bright red blood but with some dark red blood from mass, attempted to gently pull on mass but no part of mass removed easily, used spatula to gently scrape mass for pathology tissue MUSCULOSKELETAL: Normal range of motion. No edema noted.  Labs and Imaging Dg Chest 2 View  Result Date: 12/01/2016 CLINICAL DATA:  Acute onset of vaginal bleeding. Headache and palpitations. Dizziness. EXAM: CHEST  2 VIEW COMPARISON:  None. FINDINGS: The lungs are well-aerated and clear. There is no evidence of focal opacification, pleural effusion or pneumothorax. The heart is normal in size; the mediastinal contour is within  normal limits. No acute osseous abnormalities are seen. IMPRESSION: No acute cardiopulmonary process seen. Electronically Signed   By: Garald Balding M.D.   On: 12/01/2016 06:41   Ct Head Wo Contrast  Result Date: 12/01/2016 CLINICAL DATA:  Headache and nausea. EXAM: CT HEAD WITHOUT CONTRAST TECHNIQUE: Contiguous axial images were obtained from the base  of the skull through the vertex without intravenous contrast. COMPARISON:  None. FINDINGS: Brain: No evidence of acute infarction, hemorrhage, hydrocephalus, extra-axial collection or mass lesion/mass effect. Vascular: No hyperdense vessel or unexpected calcification. Skull: Normal. Negative for fracture or focal lesion. Sinuses/Orbits: No acute finding. Other: None. IMPRESSION: Normal noncontrast head CT. Electronically Signed   By: Titus Dubin M.D.   On: 12/01/2016 12:29   US Pelvis Complete  Result Date: 12/01/2016 CLINICAL DATA:  Vaginal mass on physical exam EXAM: TRANSABDOMINAL ULTRASOUND OF PELVIS TECHNIQUE: Transabdominal ultrasound examination of the pelvis was performed including evaluation of the uterus, ovaries, adnexal regions, and pelvic cul-de-sac. COMPARISON:  None. FINDINGS: Uterus Measurements: 10.8 x 4.5 x 5.9 cm. Soft tissue mass is noted in the lower uterine segment extending into the vaginal vault consistent with the given clinical history. It measures approximately 3.3 x 5.3 x 4.8 cm. Endometrium Thickness: 7 mm.  No focal abnormality visualized. Right ovary Measurements: 3.3 x 2.5 x 2.7 cm. A 2.5 cm cyst is noted within the ovary. Left ovary Nonvisualized Other findings:  No abnormal free fluid. IMPRESSION: Mass in the lower uterine segment extending into the vaginal vault as described. This is consistent with the patient's given clinical history. 2.5 cm cyst in the right ovary. Given the patient's postmenopausal state, a yearly ultrasound is recommended to assess for stability. Electronically Signed   By: Inez Catalina M.D.   On: 12/01/2016 15:Tammie    Assessment & Plan:  1. Vaginal mass - Surgical pathology - Reviewed with patient appearance of mass and concern for cancer given that she has mass/bleeding and is post-menopausal. Reviewed other possibilities including prolapsed necrotic fibroid or polyp. Reviewed that since it is moveable, impression is that it is prolapsing  through cervix and source is uterus. Reviewed that there is no way to tell until we have pathology. She consented to biopsy, and I was able to use a spatula to get a scraping of mass. No biopsy performed due to concern for bleeding. Reviewed that as it is unclear where this mass is coming from and that results of pathology will determine next steps such as D&C or referral to [facility]. Offered aygestin to attempt to decrease bleeding until we have pathology results back, she is agreeable, and it was sent to pharmacy.   2. Abnormal uterine bleeding As above   Will call patient with follow up  K. Arvilla Meres, M.D. Attending [place], [facility] for [employer], [place]

## 2016-12-10 ENCOUNTER — Ambulatory Visit: Payer: 59 | Admitting: Obstetrics and Gynecology

## 2016-12-10 ENCOUNTER — Other Ambulatory Visit (HOSPITAL_COMMUNITY)
Admission: RE | Admit: 2016-12-10 | Discharge: 2016-12-10 | Disposition: A | Discharge status: Home / Self Care | Payer: 59 | Source: Ambulatory Visit | Attending: Obstetrics and Gynecology | Admitting: Obstetrics and Gynecology

## 2016-12-10 ENCOUNTER — Telehealth: Payer: Self-pay | Admitting: Obstetrics and Gynecology

## 2016-12-10 ENCOUNTER — Encounter: Payer: Self-pay | Admitting: Obstetrics and Gynecology

## 2016-12-10 VITALS — BP 120/81 | HR 96 | Ht 64.0 in | Wt 207.0 lb

## 2016-12-10 DIAGNOSIS — Z01419 Encounter for gynecological examination (general) (routine) without abnormal findings: Secondary | ICD-10-CM

## 2016-12-10 DIAGNOSIS — N939 Abnormal uterine and vaginal bleeding, unspecified: Secondary | ICD-10-CM | POA: Diagnosis not present

## 2016-12-10 DIAGNOSIS — N888 Other specified noninflammatory disorders of cervix uteri: Secondary | ICD-10-CM | POA: Insufficient documentation

## 2016-12-10 DIAGNOSIS — Z0000 Encounter for general adult medical examination without abnormal findings: Secondary | ICD-10-CM | POA: Diagnosis present

## 2016-12-10 MED ORDER — NORETHINDRONE ACETATE 5 MG PO TABS: 5.0000 mg | ORAL_TABLET | Freq: Every day | ORAL | 0 refills | Status: DC

## 2016-12-10 NOTE — Progress Notes (Signed)
GYNECOLOGY OFFICE VISIT NOTE  History:  56 y.o. [id number] here today for follow up for vaginal mass. She reports bleeding has significantly lessened and she also passed a large clot with a "gray shiny part". Denies pain or other concerns. She is relieved that pathology from vaginal clot is benign.  Past Medical History:  Diagnosis Date  . GERD (gastroesophageal reflux disease)   . Melanoma ([place]) 1998   right leg, no chemo  . Meningitis     Past Surgical History:  Procedure Laterality Date  . cholecystectomy    . TONSILLECTOMY      Current Outpatient Medications:  .  diclofenac sodium (VOLTAREN) 1 % GEL, Apply 1 application topically., Disp: , Rfl:  .  fluticasone (FLONASE) 50 MCG/ACT nasal spray, Place 1 spray daily as needed into the nose for allergies. For congestion, Disp: , Rfl:  .  naproxen sodium (ALEVE) 220 MG tablet, Take 220 mg daily as needed by mouth (for pain)., Disp: , Rfl:  .  norethindrone (AYGESTIN) 5 MG tablet, Take 1 tablet (5 mg total) by mouth daily., Disp: 10 tablet, Rfl: 0 .  omeprazole (PRILOSEC) 10 MG capsule, Take 10 mg 2 (two) times daily as needed by mouth (for heartburn). , Disp: , Rfl:   The following portions of the patient's history were reviewed and updated as appropriate: allergies, current medications, past family history, past medical history, past social history, past surgical history and problem list.   Review of Systems:  Pertinent items noted in HPI and remainder of comprehensive ROS otherwise negative.   Objective:  Physical Exam BP 120/81   Pulse 96   Ht 5\' 4"  (1.626 m)   Wt 207 lb (93.9 kg)   LMP 12/14/2008 (Approximate)   BMI 35.53 kg/m  CONSTITUTIONAL: Well-developed, well-nourished female in no acute distress.  HENT:  Normocephalic, atraumatic. External right and left ear normal. Oropharynx is clear and moist EYES: Conjunctivae and EOM are normal. Pupils are equal, round, and reactive to light. No scleral icterus.  NECK: Normal  range of motion, supple, no masses SKIN: Skin is warm and dry. No rash noted. Not diaphoretic. No erythema. No pallor. NEUROLOGIC: Alert and oriented to person, place, and time. Normal reflexes, muscle tone coordination. No cranial nerve deficit noted. PSYCHIATRIC: Normal mood and affect. Normal behavior. Normal judgment and thought content. CARDIOVASCULAR: Normal heart rate noted RESPIRATORY: Effort normal, no problems with respiration noted ABDOMEN: Soft, no distention noted.   PELVIC: normal appearing external female genitalia, normal appearing vaginal vault with no bleeding noted, prior vaginal mass with appearance of clot now gone, cervix easily visible at apex of vagina, appears slightly open with no obvious lesion on cervix, 1 cm cystic appearing structure at 11 o'clock within cervix attached to sidewall MUSCULOSKELETAL: Normal range of motion. No edema noted.  Procedure: Reviewed risks/benefits of procedure including infection, bleeding, damage to surrounding tissue and organs. Patient agreed with plan giving informed consent and consent signed. Time out performed. Patient post menopausal. Bivalve speculum placed into vagina and cervix visualized. Pap smear done. Cervix cleansed x2 with betadine. Biopsy of cervical mass taken and sent to pathology, small amount of bleeding which improved with pressure and silver nitrate. Patient tolerated very well, given post op instructions. Will call with results.   Assessment & Plan:  56 yo post menopausal female who presented with vaginal mass, lower uterine segment mass and bleeding, now has passed large clot with cervix now easily visualized. Path of clot benign, re-exam today demonstrates pathology  from within cervix of at edge of cervix, still not clear if uterine or cervical in nature, possible normal variant of cervical structure or polyp. Reviewed that without definitive tissue biopsy from all segments, it is difficult to ensure there is no  malignancy however prior path benign and appearance of cervical polyp/mass reassuring today. She verbalizes understanding. Pap and cervical mass biopsy done today. Will review results and discuss options for further treatment with patient. Good response to aygestin, will cont this for now.   1. Annual physical exam - Cytology - PAP  2. Cervical mass - Surgical pathology - cont aygestin - post biopsy instructions given, to call with any issues   Routine preventative health maintenance measures emphasized. Please refer to After Visit Summary for other counseling recommendations.    Feliz Beam, M.D. Attending [place], [facility] for [employer], [place]

## 2016-12-10 NOTE — Telephone Encounter (Signed)
Called patient to inform her of negative biopsy results from vaginal mass scraping. She reports bleeding has lessened and she passed a large clot. Recommended removal as biopsy was negative but no guarantee that entire mass is negative, she would like to wait until January to have this done. Since she passed clot, will have patient represent to office for another exam and possible endometrial biopsy. She is agreeable.   Feliz Beam, M.D. Attending [place], [facility] for [employer], [place]

## 2016-12-11 ENCOUNTER — Encounter: Payer: Self-pay | Admitting: Obstetrics and Gynecology

## 2016-12-13 LAB — CYTOLOGY - PAP
DIAGNOSIS: NEGATIVE
HPV (WINDOPATH): NOT DETECTED

## 2016-12-14 ENCOUNTER — Telehealth: Payer: Self-pay | Admitting: Obstetrics and Gynecology

## 2016-12-14 ENCOUNTER — Other Ambulatory Visit: Payer: Self-pay | Admitting: Obstetrics and Gynecology

## 2016-12-14 NOTE — Telephone Encounter (Signed)
Called to inform patient of negative biopsy results. She passed some more clots this am with some heavy bleeding but it has slowed now. She will call to make appt with me for next week, otherwise, call the office or present to ED for heavy bleeding before then.   Feliz Beam, M.D. Attending [place], [facility] for [employer], [place]

## 2016-12-17 ENCOUNTER — Encounter: Payer: Self-pay | Admitting: *Deleted

## 2016-12-17 ENCOUNTER — Other Ambulatory Visit: Payer: Self-pay | Admitting: Obstetrics and Gynecology

## 2016-12-17 ENCOUNTER — Telehealth: Payer: Self-pay | Admitting: *Deleted

## 2016-12-17 MED ORDER — NORETHINDRONE ACETATE 5 MG PO TABS: 5.0000 mg | ORAL_TABLET | Freq: Every day | ORAL | 0 refills | Status: DC

## 2016-12-17 NOTE — Telephone Encounter (Addendum)
Received message left on nurse voicemail on 12/14/16 at 0835.  Patient states she has been seeing Dr. Rosana Hoes for postmenopausal bleeding.  States she started bleeding heavily last night and wants to know if she should come in or go to the emergency room.    Reviewed patient's chart.  Dr. Rosana Hoes spoke with patient by phone on Friday 12/14/16 at approximately 1030 am.  Patient wasn't seen in the emergency room.    Phoned patient.  States her bleeding is better.  States when she spoke to Dr. Rosana Hoes she told her to schedule an appointment for this week.  Offered patient appointment for today.  Unable to come in today.  Appointment scheduled for 12/19/16.  Patient also states she was supposed to have a prescription phoned in to [facility] in [place].  I verified with [facility] they do not have a current prescription.    Spoke with Dr. Rosana Hoes.  Orders received.  Will send to pharmancy and notify patient via My Chart message.

## 2016-12-19 ENCOUNTER — Encounter: Payer: Self-pay | Admitting: Obstetrics and Gynecology

## 2016-12-19 ENCOUNTER — Ambulatory Visit: Payer: 59 | Admitting: Obstetrics and Gynecology

## 2016-12-19 VITALS — BP 140/92 | HR 92 | Wt 206.2 lb

## 2016-12-19 DIAGNOSIS — D259 Leiomyoma of uterus, unspecified: Secondary | ICD-10-CM | POA: Diagnosis not present

## 2016-12-19 MED ORDER — NORETHINDRONE ACETATE 5 MG PO TABS: 5.0000 mg | ORAL_TABLET | Freq: Every day | ORAL | 1 refills | Status: DC

## 2016-12-19 NOTE — Patient Instructions (Signed)
Call Center for [place] at [place] [phone]  or Center for [place] at [place] [phone] to make an appoitnment

## 2016-12-20 ENCOUNTER — Encounter: Payer: Self-pay | Admitting: Obstetrics and Gynecology

## 2016-12-20 NOTE — Progress Notes (Signed)
GYNECOLOGY OFFICE VISIT NOTE  History:  56 y.o. [id number] here today for follow up. She reports bleeding is much lighter with the aygestin, has passed several clots. No other issues.   Past Medical History:  Diagnosis Date  . GERD (gastroesophageal reflux disease)   . Melanoma ([place]) 1998   right leg, no chemo  . Meningitis     Past Surgical History:  Procedure Laterality Date  . cholecystectomy    . DILATION AND CURETTAGE OF UTERUS    . TONSILLECTOMY       Current Outpatient Medications:  .  diclofenac sodium (VOLTAREN) 1 % GEL, Apply 1 application topically., Disp: , Rfl:  .  fluticasone (FLONASE) 50 MCG/ACT nasal spray, Place 1 spray daily as needed into the nose for allergies. For congestion, Disp: , Rfl:  .  naproxen sodium (ALEVE) 220 MG tablet, Take 220 mg daily as needed by mouth (for pain)., Disp: , Rfl:  .  norethindrone (AYGESTIN) 5 MG tablet, Take 1 tablet (5 mg total) by mouth daily., Disp: 30 tablet, Rfl: 1 .  omeprazole (PRILOSEC) 10 MG capsule, Take 10 mg 2 (two) times daily as needed by mouth (for heartburn). , Disp: , Rfl:   The following portions of the patient's history were reviewed and updated as appropriate: allergies, current medications, past family history, past medical history, past social history, past surgical history and problem list.   Health Maintenance:  Last pap: 11/26, negative Last mammogram: due  Review of Systems:  Pertinent items noted in HPI and remainder of comprehensive ROS otherwise negative.   Objective:  Physical Exam BP (!) 140/92   Pulse 92   Wt 206 lb 3.2 oz (93.5 kg)   LMP 12/14/2008 (Approximate)   BMI 35.39 kg/m  CONSTITUTIONAL: Well-developed, well-nourished female in no acute distress.  HENT:  Normocephalic, atraumatic. External right and left ear normal. Oropharynx is clear and moist EYES: Conjunctivae and EOM are normal. Pupils are equal, round, and reactive to light. No scleral icterus.  NECK: Normal range of  motion, supple, no masses SKIN: Skin is warm and dry. No rash noted. Not diaphoretic. No erythema. No pallor. NEUROLOGIC: Alert and oriented to person, place, and time. Normal reflexes, muscle tone coordination. No cranial nerve deficit noted. PSYCHIATRIC: Normal mood and affect. Normal behavior. Normal judgment and thought content. CARDIOVASCULAR: Normal heart rate noted RESPIRATORY: Effort and breath sounds normal, no problems with respiration noted ABDOMEN: Soft, no distention noted.   PELVIC: Deferred MUSCULOSKELETAL: Normal range of motion. No edema noted.  Labs and Imaging Dg Chest 2 View  Result Date: 12/01/2016 CLINICAL DATA:  Acute onset of vaginal bleeding. Headache and palpitations. Dizziness. EXAM: CHEST  2 VIEW COMPARISON:  None. FINDINGS: The lungs are well-aerated and clear. There is no evidence of focal opacification, pleural effusion or pneumothorax. The heart is normal in size; the mediastinal contour is within normal limits. No acute osseous abnormalities are seen. IMPRESSION: No acute cardiopulmonary process seen. Electronically Signed   By: Garald Balding M.D.   On: 12/01/2016 06:41   Ct Head Wo Contrast  Result Date: 12/01/2016 CLINICAL DATA:  Headache and nausea. EXAM: CT HEAD WITHOUT CONTRAST TECHNIQUE: Contiguous axial images were obtained from the base of the skull through the vertex without intravenous contrast. COMPARISON:  None. FINDINGS: Brain: No evidence of acute infarction, hemorrhage, hydrocephalus, extra-axial collection or mass lesion/mass effect. Vascular: No hyperdense vessel or unexpected calcification. Skull: Normal. Negative for fracture or focal lesion. Sinuses/Orbits: No acute finding. Other: None. IMPRESSION:  Normal noncontrast head CT. Electronically Signed   By: Titus Dubin M.D.   On: 12/01/2016 12:29   US Pelvis Complete  Result Date: 12/01/2016 CLINICAL DATA:  Vaginal mass on physical exam EXAM: TRANSABDOMINAL ULTRASOUND OF PELVIS TECHNIQUE:  Transabdominal ultrasound examination of the pelvis was performed including evaluation of the uterus, ovaries, adnexal regions, and pelvic cul-de-sac. COMPARISON:  None. FINDINGS: Uterus Measurements: 10.8 x 4.5 x 5.9 cm. Soft tissue mass is noted in the lower uterine segment extending into the vaginal vault consistent with the given clinical history. It measures approximately 3.3 x 5.3 x 4.8 cm. Endometrium Thickness: 7 mm.  No focal abnormality visualized. Right ovary Measurements: 3.3 x 2.5 x 2.7 cm. A 2.5 cm cyst is noted within the ovary. Left ovary Nonvisualized Other findings:  No abnormal free fluid. IMPRESSION: Mass in the lower uterine segment extending into the vaginal vault as described. This is consistent with the patient's given clinical history. 2.5 cm cyst in the right ovary. Given the patient's postmenopausal state, a yearly ultrasound is recommended to assess for stability. Electronically Signed   By: Inez Catalina M.D.   On: 12/01/2016 15:48    Assessment & Plan:  1. Uterine leiomyoma, unspecified location Patient with suspected degenerating uterine fibroid. She presented with bleeding, with subsequent mass/blood clot protruding from cervix in the setting of lower uterine mass. She has passed several large blood clots with improvement in bleeding on Aygestin. She had biopsy of cervical swelling as well as pap smear which were both negative for malignancy. She requires removal of mass, reviewed next step would be hysteroscopic removal of presumed degenerating fibroid. Reviewed that as we do not have uterine pathology and this mass is potentially cancer, however all testing thus far has been negative for malignancy. Reviewed procedure of hysteroscopic resection of degenerating fibroid, reviewed procedure, risks/benefits including infection, hemorrhage, damage to surrounding tissues and organs, risks of anesthesia including strokes/clot/death. She is agreeable to proceeding with hysteroscopic  resection but would prefer to wait until after the holidays. As biopsies have been negative thus far, I feel this is reasonable.   She will require EKG, pre-op testing, anesthesia clearance For hysteroscopic removal of presumed degenerating fibroid Will be called to schedule surgery and pre-op clearance  Routine preventative health maintenance measures emphasized. Please refer to After Visit Summary for other counseling recommendations.    Feliz Beam, M.D. Attending [place], [facility] for [employer], [place]

## 2016-12-21 ENCOUNTER — Encounter (HOSPITAL_COMMUNITY): Payer: Self-pay

## 2017-01-04 ENCOUNTER — Encounter: Payer: Self-pay | Admitting: Obstetrics and Gynecology

## 2017-01-10 ENCOUNTER — Other Ambulatory Visit: Payer: Self-pay

## 2017-01-10 ENCOUNTER — Encounter (HOSPITAL_BASED_OUTPATIENT_CLINIC_OR_DEPARTMENT_OTHER): Payer: Self-pay | Admitting: *Deleted

## 2017-01-11 ENCOUNTER — Encounter (HOSPITAL_BASED_OUTPATIENT_CLINIC_OR_DEPARTMENT_OTHER)
Admission: RE | Admit: 2017-01-11 | Discharge: 2017-01-11 | Disposition: A | Discharge status: Home / Self Care | Payer: 59 | Source: Ambulatory Visit | Attending: Obstetrics and Gynecology | Admitting: Obstetrics and Gynecology

## 2017-01-11 DIAGNOSIS — D259 Leiomyoma of uterus, unspecified: Secondary | ICD-10-CM | POA: Diagnosis not present

## 2017-01-11 LAB — BASIC METABOLIC PANEL
ANION GAP: 8 (ref 5–15)
BUN: 8 mg/dL (ref 6–20)
CALCIUM: 8.8 mg/dL — AB (ref 8.9–10.3)
CO2: 25 mmol/L (ref 22–32)
Chloride: 107 mmol/L (ref 101–111)
Creatinine, Ser: 0.84 mg/dL (ref 0.44–1.00)
GLUCOSE: 92 mg/dL (ref 65–99)
POTASSIUM: 3.9 mmol/L (ref 3.5–5.1)
SODIUM: 140 mmol/L (ref 135–145)

## 2017-01-11 LAB — CBC
HCT: 40.4 % (ref 36.0–46.0)
Hemoglobin: 13 g/dL (ref 12.0–15.0)
MCH: 29.4 pg (ref 26.0–34.0)
MCHC: 32.2 g/dL (ref 30.0–36.0)
MCV: 91.4 fL (ref 78.0–100.0)
PLATELETS: 286 10*3/uL (ref 150–400)
RBC: 4.42 MIL/uL (ref 3.87–5.11)
RDW: 13.8 % (ref 11.5–15.5)
WBC: 6.8 10*3/uL (ref 4.0–10.5)

## 2017-01-15 NOTE — Anesthesia Preprocedure Evaluation (Signed)
Anesthesia Evaluation  Patient identified by MRN, date of birth, ID band Patient awake    Reviewed: Allergy & Precautions, NPO status , Patient's Chart, lab work & pertinent test results  Airway Mallampati: II  TM Distance: >3 FB Neck ROM: Full    Dental no notable dental hx.    Pulmonary neg pulmonary ROS, sleep apnea ,    Pulmonary exam normal breath sounds clear to auscultation       Cardiovascular negative cardio ROS Normal cardiovascular exam Rhythm:Regular Rate:Normal     Neuro/Psych negative neurological ROS  negative psych ROS   GI/Hepatic negative GI ROS, Neg liver ROS, GERD  ,  Endo/Other  negative endocrine ROS  Renal/GU negative Renal ROS     Musculoskeletal negative musculoskeletal ROS (+)   Abdominal (+) + obese,   Peds  Hematology negative hematology ROS (+)   Anesthesia Other Findings   Reproductive/Obstetrics negative OB ROS                             Anesthesia Physical Anesthesia Plan  ASA: II  Anesthesia Plan: General   Post-op Pain Management:    Induction: Intravenous  PONV Risk Score and Plan:   Airway Management Planned: LMA  Additional Equipment:   Intra-op Plan:   Post-operative Plan: Extubation in OR  Informed Consent: I have reviewed the patients History and Physical, chart, labs and discussed the procedure including the risks, benefits and alternatives for the proposed anesthesia with the patient or authorized representative who has indicated his/her understanding and acceptance.   Dental advisory given  Plan Discussed with: CRNA  Anesthesia Plan Comments:         Anesthesia Quick Evaluation

## 2017-01-16 ENCOUNTER — Ambulatory Visit (HOSPITAL_BASED_OUTPATIENT_CLINIC_OR_DEPARTMENT_OTHER): Payer: PRIVATE HEALTH INSURANCE | Admitting: Anesthesiology

## 2017-01-16 ENCOUNTER — Other Ambulatory Visit: Payer: Self-pay

## 2017-01-16 ENCOUNTER — Encounter (HOSPITAL_BASED_OUTPATIENT_CLINIC_OR_DEPARTMENT_OTHER): Payer: Self-pay

## 2017-01-16 ENCOUNTER — Ambulatory Visit (HOSPITAL_BASED_OUTPATIENT_CLINIC_OR_DEPARTMENT_OTHER)
Admission: RE | Admit: 2017-01-16 | Discharge: 2017-01-16 | Disposition: A | Discharge status: Home / Self Care | Payer: PRIVATE HEALTH INSURANCE | Source: Ambulatory Visit | Attending: Obstetrics and Gynecology | Admitting: Obstetrics and Gynecology

## 2017-01-16 ENCOUNTER — Encounter (HOSPITAL_BASED_OUTPATIENT_CLINIC_OR_DEPARTMENT_OTHER)
Admission: RE | Disposition: A | Discharge status: Home / Self Care | Payer: Self-pay | Source: Ambulatory Visit | Attending: Obstetrics and Gynecology

## 2017-01-16 DIAGNOSIS — Z9049 Acquired absence of other specified parts of digestive tract: Secondary | ICD-10-CM | POA: Diagnosis not present

## 2017-01-16 DIAGNOSIS — R7303 Prediabetes: Secondary | ICD-10-CM | POA: Diagnosis not present

## 2017-01-16 DIAGNOSIS — N840 Polyp of corpus uteri: Secondary | ICD-10-CM | POA: Diagnosis not present

## 2017-01-16 DIAGNOSIS — Z8661 Personal history of infections of the central nervous system: Secondary | ICD-10-CM | POA: Diagnosis not present

## 2017-01-16 DIAGNOSIS — K219 Gastro-esophageal reflux disease without esophagitis: Secondary | ICD-10-CM | POA: Insufficient documentation

## 2017-01-16 DIAGNOSIS — G4730 Sleep apnea, unspecified: Secondary | ICD-10-CM | POA: Diagnosis not present

## 2017-01-16 DIAGNOSIS — Z791 Long term (current) use of non-steroidal anti-inflammatories (NSAID): Secondary | ICD-10-CM | POA: Insufficient documentation

## 2017-01-16 DIAGNOSIS — N939 Abnormal uterine and vaginal bleeding, unspecified: Secondary | ICD-10-CM

## 2017-01-16 DIAGNOSIS — Z85820 Personal history of malignant melanoma of skin: Secondary | ICD-10-CM | POA: Diagnosis not present

## 2017-01-16 DIAGNOSIS — Z79899 Other long term (current) drug therapy: Secondary | ICD-10-CM | POA: Diagnosis not present

## 2017-01-16 DIAGNOSIS — Z7951 Long term (current) use of inhaled steroids: Secondary | ICD-10-CM | POA: Insufficient documentation

## 2017-01-16 DIAGNOSIS — N8110 Cystocele, unspecified: Secondary | ICD-10-CM | POA: Insufficient documentation

## 2017-01-16 DIAGNOSIS — Z886 Allergy status to analgesic agent status: Secondary | ICD-10-CM | POA: Diagnosis not present

## 2017-01-16 HISTORY — DX: Prediabetes: R73.03

## 2017-01-16 HISTORY — PX: DILATATION & CURETTAGE/HYSTEROSCOPY WITH MYOSURE: SHX6511

## 2017-01-16 HISTORY — DX: Sleep apnea, unspecified: G47.30

## 2017-01-16 HISTORY — DX: Polyp of corpus uteri: N84.0

## 2017-01-16 SURGERY — DILATATION & CURETTAGE/HYSTEROSCOPY WITH MYOSURE
Anesthesia: General | Site: Uterus

## 2017-01-16 MED ORDER — PROPOFOL 500 MG/50ML IV EMUL
INTRAVENOUS | Status: AC
  Filled 2017-01-16: qty 50

## 2017-01-16 MED ORDER — KETOROLAC TROMETHAMINE 30 MG/ML IJ SOLN: 30.0000 mg | Freq: Once | INTRAMUSCULAR | Status: DC | PRN

## 2017-01-16 MED ORDER — OXYCODONE-ACETAMINOPHEN 5-325 MG PO TABS: 1.0000 | ORAL_TABLET | Freq: Four times a day (QID) | ORAL | 0 refills | Status: DC | PRN

## 2017-01-16 MED ORDER — BUPIVACAINE-EPINEPHRINE (PF) 0.5% -1:200000 IJ SOLN
INTRAMUSCULAR | Status: AC
  Filled 2017-01-16: qty 30

## 2017-01-16 MED ORDER — FENTANYL CITRATE (PF) 100 MCG/2ML IJ SOLN
INTRAMUSCULAR | Status: AC
Start: 2017-01-16 — End: ?
  Filled 2017-01-16: qty 2

## 2017-01-16 MED ORDER — FENTANYL CITRATE (PF) 100 MCG/2ML IJ SOLN
50.0000 ug | INTRAMUSCULAR | Status: DC | PRN
  Administered 2017-01-16: 100 ug via INTRAVENOUS

## 2017-01-16 MED ORDER — LIDOCAINE HCL (PF) 1 % IJ SOLN
INTRAMUSCULAR | Status: AC
  Filled 2017-01-16: qty 30

## 2017-01-16 MED ORDER — ARTIFICIAL TEARS OPHTHALMIC OINT
TOPICAL_OINTMENT | OPHTHALMIC | Status: AC
  Filled 2017-01-16: qty 3.5

## 2017-01-16 MED ORDER — PROMETHAZINE HCL 25 MG/ML IJ SOLN
INTRAMUSCULAR | Status: AC
  Filled 2017-01-16: qty 1

## 2017-01-16 MED ORDER — SCOPOLAMINE 1 MG/3DAYS TD PT72: 1.0000 | MEDICATED_PATCH | Freq: Once | TRANSDERMAL | Status: DC | PRN

## 2017-01-16 MED ORDER — PROMETHAZINE HCL 25 MG/ML IJ SOLN
6.2500 mg | INTRAMUSCULAR | Status: AC | PRN
  Administered 2017-01-16 (×2): 12.5 mg via INTRAVENOUS

## 2017-01-16 MED ORDER — SODIUM CHLORIDE 0.9 % IR SOLN
Status: DC | PRN
  Administered 2017-01-16: 3000 mL

## 2017-01-16 MED ORDER — BUPIVACAINE-EPINEPHRINE 0.25% -1:200000 IJ SOLN
INTRAMUSCULAR | Status: AC
  Filled 2017-01-16: qty 1

## 2017-01-16 MED ORDER — OXYCODONE HCL 5 MG/5ML PO SOLN: 5.0000 mg | Freq: Once | ORAL | Status: DC | PRN

## 2017-01-16 MED ORDER — DEXAMETHASONE SODIUM PHOSPHATE 10 MG/ML IJ SOLN
INTRAMUSCULAR | Status: AC
  Filled 2017-01-16: qty 1

## 2017-01-16 MED ORDER — DOCUSATE SODIUM 100 MG PO CAPS: 100.0000 mg | ORAL_CAPSULE | Freq: Two times a day (BID) | ORAL | 2 refills | Status: DC | PRN

## 2017-01-16 MED ORDER — LIDOCAINE 2% (20 MG/ML) 5 ML SYRINGE
INTRAMUSCULAR | Status: AC
  Filled 2017-01-16: qty 5

## 2017-01-16 MED ORDER — ONDANSETRON HCL 4 MG/2ML IJ SOLN
INTRAMUSCULAR | Status: DC | PRN
  Administered 2017-01-16: 4 mg via INTRAVENOUS

## 2017-01-16 MED ORDER — BUPIVACAINE HCL (PF) 0.25 % IJ SOLN
INTRAMUSCULAR | Status: AC
  Filled 2017-01-16: qty 30

## 2017-01-16 MED ORDER — FENTANYL CITRATE (PF) 100 MCG/2ML IJ SOLN
25.0000 ug | INTRAMUSCULAR | Status: DC | PRN
  Administered 2017-01-16: 50 ug via INTRAVENOUS

## 2017-01-16 MED ORDER — SILVER NITRATE-POT NITRATE 75-25 % EX MISC
CUTANEOUS | Status: AC
  Filled 2017-01-16: qty 1

## 2017-01-16 MED ORDER — MIDAZOLAM HCL 2 MG/2ML IJ SOLN
INTRAMUSCULAR | Status: AC
  Filled 2017-01-16: qty 2

## 2017-01-16 MED ORDER — OXYCODONE HCL 5 MG PO TABS: 5.0000 mg | ORAL_TABLET | Freq: Once | ORAL | Status: DC | PRN

## 2017-01-16 MED ORDER — LIDOCAINE 2% (20 MG/ML) 5 ML SYRINGE
INTRAMUSCULAR | Status: DC | PRN
  Administered 2017-01-16: 60 mg via INTRAVENOUS

## 2017-01-16 MED ORDER — MEPERIDINE HCL 25 MG/ML IJ SOLN: 6.2500 mg | INTRAMUSCULAR | Status: DC | PRN

## 2017-01-16 MED ORDER — LACTATED RINGERS IV SOLN
INTRAVENOUS | Status: DC
  Administered 2017-01-16 (×2): via INTRAVENOUS

## 2017-01-16 MED ORDER — DEXAMETHASONE SODIUM PHOSPHATE 4 MG/ML IJ SOLN
INTRAMUSCULAR | Status: DC | PRN
  Administered 2017-01-16: 10 mg via INTRAVENOUS

## 2017-01-16 MED ORDER — FENTANYL CITRATE (PF) 100 MCG/2ML IJ SOLN
INTRAMUSCULAR | Status: AC
  Filled 2017-01-16: qty 2

## 2017-01-16 MED ORDER — ONDANSETRON HCL 4 MG/2ML IJ SOLN
INTRAMUSCULAR | Status: AC
  Filled 2017-01-16: qty 2

## 2017-01-16 MED ORDER — MIDAZOLAM HCL 2 MG/2ML IJ SOLN
1.0000 mg | INTRAMUSCULAR | Status: DC | PRN
  Administered 2017-01-16: 2 mg via INTRAVENOUS

## 2017-01-16 MED ORDER — BUPIVACAINE HCL (PF) 0.5 % IJ SOLN
INTRAMUSCULAR | Status: AC
Start: 2017-01-16 — End: ?
  Filled 2017-01-16: qty 30

## 2017-01-16 MED ORDER — PROPOFOL 10 MG/ML IV BOLUS
INTRAVENOUS | Status: DC | PRN
  Administered 2017-01-16: 200 mg via INTRAVENOUS

## 2017-01-16 SURGICAL SUPPLY — 19 items
CANISTER SUCT 3000ML PPV (MISCELLANEOUS) ×3 IMPLANT
CATH ROBINSON RED A/P 16FR (CATHETERS) ×3 IMPLANT
CONTAINER PREFILL 10% NBF 60ML (FORM) ×3 IMPLANT
DEVICE MYOSURE LITE (MISCELLANEOUS) IMPLANT
DEVICE MYOSURE REACH (MISCELLANEOUS) ×3 IMPLANT
FILTER ARTHROSCOPY CONVERTOR (FILTER) ×3 IMPLANT
GLOVE BIO SURGEON STRL SZ 6.5 (GLOVE) ×2 IMPLANT
GLOVE BIO SURGEONS STRL SZ 6.5 (GLOVE) ×1
GLOVE BIOGEL PI IND STRL 6.5 (GLOVE) ×1 IMPLANT
GLOVE BIOGEL PI IND STRL 7.0 (GLOVE) ×1 IMPLANT
GLOVE BIOGEL PI INDICATOR 6.5 (GLOVE) ×2
GLOVE BIOGEL PI INDICATOR 7.0 (GLOVE) ×2
GOWN STRL REUS W/TWL LRG LVL3 (GOWN DISPOSABLE) ×6 IMPLANT
PACK VAGINAL MINOR WOMEN LF (CUSTOM PROCEDURE TRAY) ×3 IMPLANT
PAD OB MATERNITY 4.3X12.25 (PERSONAL CARE ITEMS) ×3 IMPLANT
SEAL ROD LENS SCOPE MYOSURE (ABLATOR) ×3 IMPLANT
TOWEL OR 17X24 6PK STRL BLUE (TOWEL DISPOSABLE) ×3 IMPLANT
TUBING AQUILEX INFLOW (TUBING) ×3 IMPLANT
TUBING AQUILEX OUTFLOW (TUBING) ×3 IMPLANT

## 2017-01-16 NOTE — H&P (Signed)
OB/GYN History and Physical  Tammie Gomez is a 57 y.o. [id number] presenting for hysteroscopic D&C with removal of uterine mass, likely necrosed fibroid. She has post menopausal bleeding with subsequent passing of 3 cm mass protruding from cervix. Benign path on scraping on that mass. She had normal pap and benign cervical polyp biopsy. She continues to have vaginal bleeding and desires definitive diagnosis and surgical management. She has been on progesterone and has had minimal bleeding the last few days.      Past Medical History:  Diagnosis Date  . GERD (gastroesophageal reflux disease)   . Melanoma ([place]) 1998   right leg, no chemo  . Meningitis   . Pre-diabetes   . Sleep apnea    uses CPAP nightly  . Uterine polyp     Past Surgical History:  Procedure Laterality Date  . cholecystectomy    . CHOLECYSTECTOMY    . DILATION AND CURETTAGE OF UTERUS    . TONSILLECTOMY      OB History  Gravida Para Term Preterm AB Living  6 2 2     2   SAB TAB Ectopic Multiple Live Births               # Outcome Date GA Lbr Len/2nd Weight Sex Delivery Anes PTL Lv  6 Gravida           5 Gravida           4 Gravida           3 Gravida           2 Term      Vag-Spont     1 Term      Vag-Spont         Social History   Socioeconomic History  . Marital status: Married    Spouse name: None  . Number of children: None  . Years of education: None  . Highest education level: None  Social Needs  . Financial resource strain: None  . Food insecurity - worry: None  . Food insecurity - inability: None  . Transportation needs - medical: None  . Transportation needs - non-medical: None  Occupational History  . None  Tobacco Use  . Smoking status: Never Smoker  . Smokeless tobacco: Never Used  Substance and Sexual Activity  . Alcohol use: Yes  . Drug use: No  . Sexual activity: No    Birth control/protection: None  Other Topics Concern  . None  Social History Narrative  . None     History reviewed. No pertinent family history.  No medications prior to admission.    Allergies  Allergen Reactions  . Etodolac Hives    Review of Systems: Negative except for what is mentioned in HPI.     Physical Exam: Ht 5\' 4"  (1.626 m)   Wt 205 lb (93 kg)   LMP 12/14/2008 (Approximate)   BMI 35.19 kg/m  CONSTITUTIONAL: Well-developed, well-nourished female in no acute distress.  HENT:  Normocephalic, atraumatic, External right and left ear normal. Oropharynx is clear and moist EYES: Conjunctivae and EOM are normal. Pupils are equal, round, and reactive to light. No scleral icterus.  NECK: Normal range of motion, supple, no masses SKIN: Skin is warm and dry. No rash noted. Not diaphoretic. No erythema. No pallor. [place]: Alert and oriented to person, place, and time. Normal reflexes, muscle tone coordination. No cranial nerve deficit noted. PSYCHIATRIC: Normal mood and affect. Normal behavior. Normal judgment and thought content. CARDIOVASCULAR: Normal  heart rate noted, regular rhythm RESPIRATORY: Effort and breath sounds normal, no problems with respiration noted ABDOMEN: Soft, nontender, nondistended, gravid.  PELVIC: Deferred MUSCULOSKELETAL: Normal range of motion. No edema and no tenderness. 2+ distal pulses.  Pertinent Labs/Studies:    CBC Latest Ref Rng & Units 01/11/2017 12/01/2016  WBC 4.0 - 10.5 K/uL 6.8 9.3  Hemoglobin 12.0 - 15.0 g/dL 13.0 14.6  Hematocrit 36.0 - 46.0 % 40.4 43.1  Platelets 150 - 400 K/uL 286 309   BMP Latest Ref Rng & Units 01/11/2017 12/01/2016  Glucose 65 - 99 mg/dL 92 96  BUN 6 - 20 mg/dL 8 13  Creatinine 0.44 - 1.00 mg/dL 0.84 0.90  Sodium 135 - 145 mmol/L 140 139  Potassium 3.5 - 5.1 mmol/L 3.9 3.8  Chloride 101 - 111 mmol/L 107 104  CO2 22 - 32 mmol/L 25 26  Calcium 8.9 - 10.3 mg/dL 8.8(L) 9.2        Assessment and Plan :Tammie Gomez is a 57 y.o. [id number] admitted for surgical management of uterine bleeding, s/p  spontaneous passage of 3 cm mass which has previously biopsied and noted to be benign. She was also noted on ultrasound at that time to have a lower uterine segment mass. She has a history of uterine fibroids and mass/remnant is likely a necrosed fibroid or uterine polyp. Reviewed recommendation for hysteroscopic removal of tissue and definitive diagnosis to which she is agreeable. Also reviewed that this is potentially a cancer, which would necessitate further treatment/surgery, however she understands that will not be known until pathology is back. Reviewed that in the event the tissue appears cancerous, will obtain a biopsy and await final diagnosis. She understands that in the event of a malignant diagnosis, she will be referred to gyn oncology.    The risks of hysteroscopic myomectomy/polypectomy with D&C were reviewed with the patient; including but not limited to: infection which may require antibiotics; bleeding which may require transfusion or re-operation; injury to bowel, bladder, ureters or other surrounding organs; need for additional procedures including hysterectomy in the event of a life-threatening hemorrhage; thromboembolic phenomenon and other postoperative/anesthesia complications. She is agreeable to a blood transfusion in the event of emergency. The patient concurred with the proposed plan, giving informed consent for the procedure.  Patient has been NPO.   Feliz Beam, M.D. Attending [place], [facility] for [employer], [place]

## 2017-01-16 NOTE — Anesthesia Postprocedure Evaluation (Signed)
Anesthesia Post Note  Patient: Tammie Gomez  Procedure(s) Performed: DILATATION & CURETTAGE/HYSTEROSCOPY WITH MYOSURE (N/A Uterus)     Patient location during evaluation: PACU Anesthesia Type: General Level of consciousness: sedated and patient cooperative Pain management: pain level controlled Vital Signs Assessment: post-procedure vital signs reviewed and stable Respiratory status: spontaneous breathing Cardiovascular status: stable Anesthetic complications: no    Last Vitals:  Vitals:   01/16/17 1215 01/16/17 1223  BP: 113/78 (!) 127/57  Pulse: 84 84  Resp: 14 18  Temp:  36.8 C  SpO2: 97% 100%    Last Pain:  Vitals:   01/16/17 1223  TempSrc: Oral  PainSc: 0-No pain                 Nolon Nations

## 2017-01-16 NOTE — Transfer of Care (Signed)
Immediate Anesthesia Transfer of Care Note  Patient: Tammie Gomez  Procedure(s) Performed: DILATATION & CURETTAGE/HYSTEROSCOPY WITH MYOSURE (N/A Uterus)  Patient Location: PACU  Anesthesia Type:General  Level of Consciousness: awake and alert   Airway & Oxygen Therapy: Patient Spontanous Breathing and Patient connected to face mask oxygen  Post-op Assessment: Report given to RN and Post -op Vital signs reviewed and stable  Post vital signs: Reviewed and stable  Last Vitals:  Vitals:   01/16/17 0831  BP: 125/76  Pulse: 80  Resp: 18  Temp: 36.9 C  SpO2: 100%    Last Pain:  Vitals:   01/16/17 0831  TempSrc: Oral         Complications: No apparent anesthesia complications

## 2017-01-16 NOTE — Discharge Instructions (Signed)
Hysteroscopy, Care After Refer to this sheet in the next few weeks. These instructions provide you with information on caring for yourself after your procedure. Your health care provider may also give you more specific instructions. Your treatment has been planned according to current medical practices, but problems sometimes occur. Call your health care provider if you have any problems or questions after your procedure. What can I expect after the procedure? After your procedure, it is typical to have the following:  You may have some cramping. This normally lasts for a couple days.  You may have bleeding. This can vary from light spotting for a few days to menstrual-like bleeding for 3-7 days.  Follow these instructions at home:  Rest for the first 1-2 days after the procedure.  Only take over-the-counter or prescription medicines as directed by your health care provider. Do not take aspirin. It can increase the chances of bleeding.  Take showers instead of baths for 2 weeks or as directed by your health care provider.  Do not drive for 24 hours or as directed.  Do not drink alcohol while taking pain medicine.  Do not use tampons, douche, or have sexual intercourse for 2 weeks or until your health care provider says it is okay.  Take your temperature twice a day for 4-5 days. Write it down each time.  Follow your health care provider's advice about diet, exercise, and lifting.  If you develop constipation, you may: ? Take a mild laxative if your health care provider approves. ? Add bran foods to your diet. ? Drink enough fluids to keep your urine clear or pale yellow.  Try to have someone with you or available to you for the first 24-48 hours, especially if you were given a general anesthetic.  Follow up with your health care provider as directed. Contact a health care provider if:  You feel dizzy or lightheaded.  You feel sick to your stomach (nauseous).  You have  abnormal vaginal discharge.  You have a rash.  You have pain that is not controlled with medicine. Get help right away if:  You have bleeding that is heavier than a normal menstrual period.  You have a fever.  You have increasing cramps or pain, not controlled with medicine.  You have new belly (abdominal) pain.  You pass out.  You have pain in the tops of your shoulders (shoulder strap areas).  You have shortness of breath. This information is not intended to replace advice given to you by your health care provider. Make sure you discuss any questions you have with your health care provider. Document Released: 10/22/2012 Document Revised: 06/09/2015 Document Reviewed: 07/31/2012 Elsevier Interactive Patient Education  2017 [place] [facility] Home Care Instructions  Activity: Get plenty of rest for the remainder of the day. A responsible individual must stay with you for 24 hours following the procedure.  For the next 24 hours, DO NOT: -Drive a car -Paediatric nurse -Drink alcoholic beverages -Take any medication unless instructed by your physician -Make any legal decisions or sign important papers.  Meals: Start with liquid foods such as gelatin or soup. Progress to regular foods as tolerated. Avoid greasy, spicy, heavy foods. If nausea and/or vomiting occur, drink only clear liquids until the nausea and/or vomiting subsides. Call your physician if vomiting continues.  Special Instructions/Symptoms: Your throat may feel dry or sore from the anesthesia or the breathing tube placed in your throat during surgery. If this causes discomfort, gargle  with warm salt water. The discomfort should disappear within 24 hours.  If you had a scopolamine patch placed behind your ear for the management of post- operative nausea and/or vomiting:  1. The medication in the patch is effective for 72 hours, after which it should be removed.  Wrap patch in a tissue and discard in  the trash. Wash hands thoroughly with soap and water. 2. You may remove the patch earlier than 72 hours if you experience unpleasant side effects which may include dry mouth, dizziness or visual disturbances. 3. Avoid touching the patch. Wash your hands with soap and water after contact with the patch.  DISCHARGE INSTRUCTIONS: D&C / D&E The following instructions have been prepared to help you care for yourself upon your return home.   Personal hygiene:  Use sanitary pads for vaginal drainage, not tampons.  Shower the day after your procedure.  NO tub baths, pools or Jacuzzis for 2-3 weeks.  Wipe front to back after using the bathroom.  Activity and limitations:  Do NOT drive or operate any equipment for 24 hours. The effects of anesthesia are still present and drowsiness may result.  Do NOT rest in bed all day.  Walking is encouraged.  Walk up and down stairs slowly.  You may resume your normal activity in one to two days or as indicated by your physician.  Sexual activity: NO intercourse for at least 2 weeks after the procedure, or as indicated by your physician.  Diet: Eat a light meal as desired this evening. You may resume your usual diet tomorrow.  Return to work: You may resume your work activities in one to two days or as indicated by your doctor.  What to expect after your surgery: Expect to have vaginal bleeding/discharge for 2-3 days and spotting for up to 10 days. It is not unusual to have soreness for up to 1-2 weeks. You may have a slight burning sensation when you urinate for the first day. Mild cramps may continue for a couple of days. You may have a regular period in 2-6 weeks.  Call your doctor for any of the following:  Excessive vaginal bleeding, saturating and changing one pad every hour.  Inability to urinate 6 hours after discharge from hospital.  Pain not relieved by pain medication.  Fever of 100.4 F or greater.  Unusual vaginal discharge or  odor.   Call for an appointment:    Patients signature: ______________________  Nurses signature ________________________  Support person's signature_______________________

## 2017-01-16 NOTE — Op Note (Signed)
Tammie Gomez PROCEDURE DATE: 01/16/2017   PREOPERATIVE DIAGNOSIS:  abnormal uterine bleeding, uterine mass  POSTOPERATIVE DIAGNOSIS: AUB, endometrial polyp  PROCEDURE: Operative Hysteroscopic polypectomy, dilation and curettage  SURGEON:  Dr. Feliz Beam  ANESTHESIOLOGY TEAM: Anesthesiologist: Nolon Nations, MD CRNA: Lieutenant Diego, CRNA  INDICATIONS: 57 y.o. [id number]  here for scheduled surgery for the aforementioned diagnoses.   Risks of surgery were discussed with the patient including but not limited to: bleeding which may require transfusion; infection which may require antibiotics; injury to uterus or surrounding organs; intrauterine scarring which may impair future fertility; need for additional procedures including laparotomy or laparoscopy; and other postoperative/anesthesia complications. Written informed consent was obtained.    FINDINGS: Normal appearing female genitalia with multiparous normal appearing cervix with some descent. Mild cystocele noted.  10 weeks size uterus. Diffuse proliferative endometrium with large endometrial polyp attached posteriorly near the left cornua. Normal ostia bilaterally.  ANESTHESIA:   General INTRAVENOUS FLUIDS:  700 ml of LR FLUID DEFICITS:  145 ml of normal saline ESTIMATED BLOOD LOSS:  Less than 20 ml URINE OUTPUT: unmeasured, clear yellow urine SPECIMENS: endometrial polyp, endometrial curettings sent to pathology COMPLICATIONS:  None immediate.  PROCEDURE DETAILS:  The patient was seen in the pre-op area where the plan was reviewed and she again verbalized understanding and consent.  She was then taken to the operating room where general anesthesia was administered.  After an adequate timeout was performed, she was placed in the dorsal lithotomy position and examined; then prepped and draped in the sterile manner. A straight catheter was inserted into the bladder sterilely and the bladder was drained.  A weighted speculum was then  placed in the patient's vagina and a Sims retractor used to visualize the cervix. A single toothed tenaculum was applied to the anterior lip of the cervix.  The cervix then dilated manually with metal dilators to accommodate the 6 mm operative hysteroscope.  Once the cervix was dilated, the hysteroscope was inserted under direct visualization using normal saline as the suspension medium.  The uterine cavity was carefully examined with the findings as noted above. The operative channel was introduced into the cavity and the polyp was removed using the Myosure reach device under direct visualization. Once the polyp was removed, the cavity was again viewed and noted to be free of other pathology. The hysteroscope was then removed under direct visualization.  A sharp curettage was then performed to obtain a moderate amount of endometrial curettings. One final curettage was performed with a serrated curette and the products sent to pathology.  The tenaculum was removed from the anterior lip of the cervix with good hemostasis noted at the tenaculum site. All instruments were remvoed from the vagina.  The patient tolerated the procedure well and was taken to the recovery area awake, extubated and in stable condition.  The patient will be discharged to home as per PACU criteria.  Routine postoperative instructions given.  She was prescribed Percocet, Ibuprofen and Colace.  She will follow up in the clinic in two weeks  for postoperative evaluation.    Feliz Beam, M.D. Attending [place], [facility] for [employer], [place]

## 2017-01-16 NOTE — Anesthesia Procedure Notes (Signed)
Procedure Name: LMA Insertion Date/Time: 01/16/2017 10:07 AM Performed by: Lieutenant Diego, CRNA Pre-anesthesia Checklist: Patient identified, Emergency Drugs available, Suction available and Patient being monitored Patient Re-evaluated:Patient Re-evaluated prior to induction Oxygen Delivery Method: Circle system utilized Preoxygenation: Pre-oxygenation with 100% oxygen Induction Type: IV induction Ventilation: Mask ventilation without difficulty LMA: LMA inserted LMA Size: 4.0 Number of attempts: 1 Airway Equipment and Method: Bite block Placement Confirmation: positive ETCO2 and breath sounds checked- equal and bilateral Tube secured with: Tape Dental Injury: Teeth and Oropharynx as per pre-operative assessment

## 2017-01-17 ENCOUNTER — Encounter (HOSPITAL_BASED_OUTPATIENT_CLINIC_OR_DEPARTMENT_OTHER): Payer: Self-pay | Admitting: Obstetrics and Gynecology

## 2017-01-21 ENCOUNTER — Telehealth: Payer: Self-pay | Admitting: Obstetrics and Gynecology

## 2017-01-21 NOTE — Telephone Encounter (Signed)
Called patient to advise of negative pathology after D&C, hysteroscopic polypectomy. She is feeling well, but tired, reports more fatigue than she was expecting. Minimal bleeding. Did lose taste sensation after surgery and it is slowly coming back. She will follow up in office and call with any further issues.   Feliz Beam, M.D. Attending [place], [facility] for [employer], [place]

## 2018-05-26 ENCOUNTER — Encounter (HOSPITAL_COMMUNITY): Payer: Self-pay

## 2018-05-26 ENCOUNTER — Other Ambulatory Visit: Payer: Self-pay

## 2018-05-26 ENCOUNTER — Emergency Department (HOSPITAL_COMMUNITY)
Admission: EM | Admit: 2018-05-26 | Discharge: 2018-05-27 | Disposition: A | Discharge status: Home / Self Care | Payer: 59 | Attending: Emergency Medicine | Admitting: Emergency Medicine

## 2018-05-26 DIAGNOSIS — R109 Unspecified abdominal pain: Secondary | ICD-10-CM | POA: Diagnosis present

## 2018-05-26 DIAGNOSIS — R1084 Generalized abdominal pain: Secondary | ICD-10-CM | POA: Diagnosis not present

## 2018-05-26 DIAGNOSIS — K439 Ventral hernia without obstruction or gangrene: Secondary | ICD-10-CM

## 2018-05-26 DIAGNOSIS — Z79899 Other long term (current) drug therapy: Secondary | ICD-10-CM | POA: Diagnosis not present

## 2018-05-26 DIAGNOSIS — N390 Urinary tract infection, site not specified: Secondary | ICD-10-CM | POA: Diagnosis not present

## 2018-05-26 DIAGNOSIS — M5489 Other dorsalgia: Secondary | ICD-10-CM | POA: Insufficient documentation

## 2018-05-26 DIAGNOSIS — R6884 Jaw pain: Secondary | ICD-10-CM | POA: Insufficient documentation

## 2018-05-26 LAB — I-STAT BETA HCG BLOOD, ED (MC, WL, AP ONLY): I-stat hCG, quantitative: <5 m[IU]/mL (ref <5)

## 2018-05-26 LAB — CBC
HCT: 42.1 % (ref 36.0–46.0)
Hemoglobin: 13.9 g/dL (ref 12.0–15.0)
MCH: 28.9 pg (ref 26.0–34.0)
MCHC: 33 g/dL (ref 30.0–36.0)
MCV: 87.5 fL (ref 80.0–100.0)
Platelets: 377 10*3/uL (ref 150–400)
RBC: 4.81 MIL/uL (ref 3.87–5.11)
RDW: 12.6 % (ref 11.5–15.5)
WBC: 12.2 10*3/uL — ABNORMAL HIGH (ref 4.0–10.5)
nRBC: 0 % (ref 0.0–0.2)

## 2018-05-26 MED ORDER — ONDANSETRON 4 MG PO TBDP
4.0000 mg | ORAL_TABLET | Freq: Once | ORAL | Status: AC
  Administered 2018-05-26: 4 mg via ORAL
  Filled 2018-05-26: qty 1

## 2018-05-26 MED ORDER — SODIUM CHLORIDE 0.9% FLUSH: 3.0000 mL | Freq: Once | INTRAVENOUS | Status: DC

## 2018-05-26 MED ORDER — ONDANSETRON HCL 4 MG PO TABS
4.0000 mg | ORAL_TABLET | Freq: Once | ORAL | Status: DC
  Filled 2018-05-26: qty 1

## 2018-05-26 MED ORDER — OXYCODONE-ACETAMINOPHEN 5-325 MG PO TABS
1.0000 | ORAL_TABLET | Freq: Once | ORAL | Status: DC
  Filled 2018-05-26: qty 1

## 2018-05-26 NOTE — ED Notes (Signed)
Pt actively vomiting in triage, unable to give pain medication ordered

## 2018-05-26 NOTE — ED Triage Notes (Signed)
Pt reports LLQ pain and vomiting since yesterday along with jaw pain and thoracic back pain. Generalized weakness all over. Pt vomited 2 times today.

## 2018-05-27 ENCOUNTER — Emergency Department (HOSPITAL_COMMUNITY): Payer: 59

## 2018-05-27 ENCOUNTER — Encounter (HOSPITAL_COMMUNITY): Payer: Self-pay | Admitting: Radiology

## 2018-05-27 LAB — COMPREHENSIVE METABOLIC PANEL
ALT: 20 U/L (ref 0–44)
AST: 22 U/L (ref 15–41)
Albumin: 4.1 g/dL (ref 3.5–5.0)
Alkaline Phosphatase: 70 U/L (ref 38–126)
Anion gap: 14 (ref 5–15)
BUN: 12 mg/dL (ref 6–20)
CO2: 20 mmol/L — ABNORMAL LOW (ref 22–32)
Calcium: 9.7 mg/dL (ref 8.9–10.3)
Chloride: 104 mmol/L (ref 98–111)
Creatinine, Ser: 1.11 mg/dL — ABNORMAL HIGH (ref 0.44–1.00)
GFR calc Af Amer: >60 mL/min (ref >60)
GFR calc non Af Amer: 54 mL/min — ABNORMAL LOW (ref >60)
Glucose, Bld: 122 mg/dL — ABNORMAL HIGH (ref 70–99)
Potassium: 3.9 mmol/L (ref 3.5–5.1)
Sodium: 138 mmol/L (ref 135–145)
Total Bilirubin: 0.7 mg/dL (ref 0.3–1.2)
Total Protein: 7.4 g/dL (ref 6.5–8.1)

## 2018-05-27 LAB — TROPONIN I: Troponin I: <0.03 ng/mL (ref <0.03)

## 2018-05-27 LAB — URINALYSIS, MICROSCOPIC (REFLEX)

## 2018-05-27 LAB — URINALYSIS, ROUTINE W REFLEX MICROSCOPIC
Bilirubin Urine: NEGATIVE
Glucose, UA: NEGATIVE mg/dL
Ketones, ur: 40 mg/dL — AB
Leukocytes,Ua: NEGATIVE
Nitrite: POSITIVE — AB
Protein, ur: NEGATIVE mg/dL
Specific Gravity, Urine: <1.005 — ABNORMAL LOW (ref 1.005–1.030)
pH: 7 (ref 5.0–8.0)

## 2018-05-27 LAB — LIPASE, BLOOD: Lipase: 51 U/L (ref 11–51)

## 2018-05-27 MED ORDER — ONDANSETRON HCL 4 MG/2ML IJ SOLN
4.0000 mg | Freq: Once | INTRAMUSCULAR | Status: AC
  Administered 2018-05-27: 02:00:00 4 mg via INTRAVENOUS
  Filled 2018-05-27: qty 2

## 2018-05-27 MED ORDER — HYDROCODONE-ACETAMINOPHEN 5-325 MG PO TABS
2.0000 | ORAL_TABLET | Freq: Once | ORAL | Status: AC
  Administered 2018-05-27: 05:00:00 2 via ORAL
  Filled 2018-05-27: qty 2

## 2018-05-27 MED ORDER — MORPHINE SULFATE (PF) 4 MG/ML IV SOLN
4.0000 mg | Freq: Once | INTRAVENOUS | Status: AC
  Administered 2018-05-27: 4 mg via INTRAVENOUS
  Filled 2018-05-27: qty 1

## 2018-05-27 MED ORDER — SODIUM CHLORIDE 0.9 % IV SOLN
1.0000 g | Freq: Once | INTRAVENOUS | Status: AC
  Administered 2018-05-27: 05:00:00 1 g via INTRAVENOUS
  Filled 2018-05-27: qty 10

## 2018-05-27 MED ORDER — SODIUM CHLORIDE 0.9 % IV BOLUS
1000.0000 mL | Freq: Once | INTRAVENOUS | Status: AC
  Administered 2018-05-27: 1000 mL via INTRAVENOUS

## 2018-05-27 MED ORDER — SODIUM CHLORIDE 0.9 % IV BOLUS
1000.0000 mL | Freq: Once | INTRAVENOUS | Status: AC
  Administered 2018-05-27: 02:00:00 1000 mL via INTRAVENOUS

## 2018-05-27 MED ORDER — IOHEXOL 300 MG/ML  SOLN
100.0000 mL | Freq: Once | INTRAMUSCULAR | Status: AC | PRN
  Administered 2018-05-27: 100 mL via INTRAVENOUS

## 2018-05-27 MED ORDER — CEPHALEXIN 500 MG PO CAPS: 500.0000 mg | ORAL_CAPSULE | Freq: Two times a day (BID) | ORAL | 0 refills | Status: DC

## 2018-05-27 MED ORDER — HYDROCODONE-ACETAMINOPHEN 5-325 MG PO TABS: 1.0000 | ORAL_TABLET | Freq: Four times a day (QID) | ORAL | 0 refills | Status: DC | PRN

## 2018-05-27 MED ORDER — ONDANSETRON HCL 4 MG/2ML IJ SOLN
4.0000 mg | Freq: Once | INTRAMUSCULAR | Status: AC
  Administered 2018-05-27: 04:00:00 4 mg via INTRAVENOUS
  Filled 2018-05-27: qty 2

## 2018-05-27 MED ORDER — ONDANSETRON 4 MG PO TBDP: 4.0000 mg | ORAL_TABLET | Freq: Three times a day (TID) | ORAL | 0 refills | Status: DC | PRN

## 2018-05-27 NOTE — ED Provider Notes (Signed)
[place] EMERGENCY DEPARTMENT Provider Note   CSN: [id number] Arrival date & time: 05/26/18  2256    History   Chief Complaint Chief Complaint  Patient presents with   Abdominal Pain   Jaw Pain   Back Pain    HPI Tammie Gomez is a 58 y.o. female.     Patient presents to the emergency department with a chief complaint of abdominal pain.  She reports associated nausea and vomiting.  She states that the pain is mostly located in the center of her abdomen.  She states that it increases with position changes, and improves when she holds her lower abdomen.  She denies any fevers chills.  Denies any cough shortness of breath.  Denies any dysuria or hematuria.  She has not taken anything for her symptoms.  Past surgical history includes cholecystectomy.  The history is provided by the patient. No language interpreter was used.    Past Medical History:  Diagnosis Date   GERD (gastroesophageal reflux disease)    Melanoma ([place]) 1998   right leg, no chemo   Meningitis    Pre-diabetes    Sleep apnea    uses CPAP nightly   Uterine polyp     There are no active problems to display for this patient.   Past Surgical History:  Procedure Laterality Date   cholecystectomy     CHOLECYSTECTOMY     DILATATION & CURETTAGE/HYSTEROSCOPY WITH MYOSURE N/A 01/16/2017   Procedure: DILATATION & CURETTAGE/HYSTEROSCOPY WITH MYOSURE;  Surgeon: Sloan Leiter, MD;  Location: [facility];  Service: Gynecology;  Laterality: N/A;   DILATION AND CURETTAGE OF UTERUS     TONSILLECTOMY       OB History    Gravida  6   Para  2   Term  2   Preterm      AB      Living  2     SAB      TAB      Ectopic      Multiple      Live Births               Home Medications    Prior to Admission medications   Medication Sig Start Date End Date Taking? Authorizing Provider  docusate sodium (COLACE) 100 MG capsule Take 1 capsule (100 mg  total) by mouth 2 (two) times daily as needed for moderate constipation. 01/16/17   Sloan Leiter, MD  fluticasone [facility]) 50 MCG/ACT nasal spray Place 1 spray daily as needed into the nose for allergies. For congestion    [provider]  naproxen sodium (ALEVE) 220 MG tablet Take 220 mg daily as needed by mouth (for pain).    [provider]  omeprazole (PRILOSEC) 10 MG capsule Take 10 mg 2 (two) times daily as needed by mouth (for heartburn).     [provider]  oxyCODONE-acetaminophen (PERCOCET/ROXICET) 5-325 MG tablet Take 1-2 tablets by mouth every 6 (six) hours as needed. 01/16/17   Sloan Leiter, MD    Family History No family history on file.  Social History Social History   Tobacco Use   Smoking status: Never Smoker   Smokeless tobacco: Never Used  Substance Use Topics   Alcohol use: Yes   Drug use: No     Allergies   Etodolac   Review of Systems Review of Systems  All other systems reviewed and are negative.    Physical Exam Updated Vital  Signs BP (!) 157/90    Pulse (!) 107    Temp 97.7 F (36.5 C) (Oral)    Resp 20    LMP 12/14/2008 (Approximate)    SpO2 98%   Physical Exam Vitals signs and nursing note reviewed.  Constitutional:      General: She is not in acute distress.    Appearance: She is well-developed.  HENT:     Head: Normocephalic and atraumatic.  Eyes:     Conjunctiva/sclera: Conjunctivae normal.  Neck:     Musculoskeletal: Neck supple.  Cardiovascular:     Rate and Rhythm: Normal rate and regular rhythm.     Heart sounds: No murmur.  Pulmonary:     Effort: Pulmonary effort is normal. No respiratory distress.     Breath sounds: Normal breath sounds.  Abdominal:     Palpations: Abdomen is soft.     Tenderness: There is abdominal tenderness in the periumbilical area and suprapubic area.  Skin:    General: Skin is warm and dry.  Neurological:     Mental Status: She is alert.      ED Treatments /  Results  Labs (all labs ordered are listed, but only abnormal results are displayed) Labs Reviewed  COMPREHENSIVE METABOLIC PANEL - Abnormal; Notable for the following components:      Result Value   CO2 20 (*)    Glucose, Bld 122 (*)    Creatinine, Ser 1.11 (*)    GFR calc non Af Amer 54 (*)    All other components within normal limits  CBC - Abnormal; Notable for the following components:   WBC 12.2 (*)    All other components within normal limits  URINALYSIS, ROUTINE W REFLEX MICROSCOPIC - Abnormal; Notable for the following components:   Specific Gravity, Urine <1.005 (*)    Hgb urine dipstick TRACE (*)    Ketones, ur 40 (*)    Nitrite POSITIVE (*)    All other components within normal limits  URINALYSIS, MICROSCOPIC (REFLEX) - Abnormal; Notable for the following components:   Bacteria, UA RARE (*)    All other components within normal limits  LIPASE, BLOOD  TROPONIN I  I-STAT BETA HCG BLOOD, ED ([facility], [facility], [facility] ONLY)    EKG EKG Interpretation  Date/Time:  Monday May 26 2018 23:13:25 EDT Ventricular Rate:  104 PR Interval:  136 QRS Duration: 78 QT Interval:  348 QTC Calculation: 457 R Axis:   76 Text Interpretation:  Sinus tachycardia Otherwise normal ECG Confirmed by Thayer Jew [phone]) on 05/27/2018 4:40:22 AM   Radiology Ct Abdomen Pelvis W Contrast  Result Date: 05/27/2018 CLINICAL DATA:  Nausea, vomiting and abdominal pain. EXAM: CT ABDOMEN AND PELVIS WITH CONTRAST TECHNIQUE: Multidetector CT imaging of the abdomen and pelvis was performed using the standard protocol following bolus administration of intravenous contrast. CONTRAST:  156mL OMNIPAQUE IOHEXOL 300 MG/ML  SOLN COMPARISON:  None. FINDINGS: LOWER CHEST: There is no basilar pleural or apical pericardial effusion. HEPATOBILIARY: The hepatic contours and density are normal. There is no intra- or extrahepatic biliary dilatation. Status post cholecystectomy. PANCREAS: The pancreatic parenchymal contours are  normal and there is no ductal dilatation. There is no peripancreatic fluid collection. SPLEEN: Normal. ADRENALS/URINARY TRACT: --Adrenal glands: Normal. --Right kidney/ureter: No hydronephrosis, nephroureterolithiasis, perinephric stranding or solid renal mass. --Left kidney/ureter: No hydronephrosis, nephroureterolithiasis, perinephric stranding or solid renal mass. --Urinary bladder: Normal for degree of distention STOMACH/BOWEL: --Stomach/Duodenum: There is no hiatal hernia or other gastric abnormality. The duodenal course and  caliber are normal. --Small bowel: No dilatation or inflammation. --Colon: No focal abnormality. --Appendix: Normal. VASCULAR/LYMPHATIC: Normal course and caliber of the major abdominal vessels. No abdominal or pelvic lymphadenopathy. REPRODUCTIVE: Normal uterus and ovaries. MUSCULOSKELETAL. No bony spinal canal stenosis or focal osseous abnormality. OTHER: Fat containing ventral abdominal hernia without inflammation. IMPRESSION: 1. No acute abnormality of the abdomen or pelvis. 2. Fat containing ventral abdominal hernia. Electronically Signed   By: Ulyses Jarred M.D.   On: 05/27/2018 02:53    Procedures Procedures (including critical care time)  Medications Ordered in ED Medications  sodium chloride flush (NS) 0.9 % injection 3 mL (has no administration in time range)  cefTRIAXone (ROCEPHIN) 1 g in sodium chloride 0.9 % 100 mL IVPB (has no administration in time range)  HYDROcodone-acetaminophen (NORCO/VICODIN) 5-325 MG per tablet 2 tablet (has no administration in time range)  ondansetron (ZOFRAN-ODT) disintegrating tablet 4 mg (4 mg Oral Given 05/26/18 2312)  sodium chloride 0.9 % bolus 1,000 mL (0 mLs Intravenous Stopped 05/27/18 0425)  ondansetron (ZOFRAN) injection 4 mg (4 mg Intravenous Given 05/27/18 0144)  morphine 4 MG/ML injection 4 mg (4 mg Intravenous Given 05/27/18 0144)  iohexol (OMNIPAQUE) 300 MG/ML solution 100 mL (100 mLs Intravenous Contrast Given 05/27/18 0215)   morphine 4 MG/ML injection 4 mg (4 mg Intravenous Given 05/27/18 0425)  ondansetron (ZOFRAN) injection 4 mg (4 mg Intravenous Given 05/27/18 0425)  sodium chloride 0.9 % bolus 1,000 mL (1,000 mLs Intravenous New Bag/Given 05/27/18 0427)     Initial Impression / Assessment and Plan / ED Course  I have reviewed the triage vital signs and the nursing notes.  Pertinent labs & imaging results that were available during my care of the patient were reviewed by me and considered in my medical decision making (see chart for details).        Patient with abdominal pain, nausea, and vomiting.  Quite tender over periumbilical region.  Will check labs and CT.  Labs show mild leukocytosis.  Lipase normal.  LFTs normal.    CT notable for fat containing ventral hernia, but no other acute findings.  UA consistent with infection.  Will continue with pain control in ED.  Will given 1g IV rocephin.  Plan for discharge to home with pain meds, zofran, and abx.  Recommend surgery follow-up for eval of ventral hernia.   Recommend PCP f/u for UTI.  Return to ED for new or worsening symptoms.  Patient seen by and discussed with Dr. Dina Rich, who agrees with the plan.  Patient tolerating PO in ED.  Final Clinical Impressions(s) / ED Diagnoses   Final diagnoses:  Generalized abdominal pain  Urinary tract infection without hematuria, site unspecified  Ventral hernia without obstruction or gangrene    ED Discharge Orders         Ordered    HYDROcodone-acetaminophen (NORCO/VICODIN) 5-325 MG tablet  Every 6 hours PRN     05/27/18 0524    cephALEXin (KEFLEX) 500 MG capsule  2 times daily     05/27/18 0524    ondansetron (ZOFRAN ODT) 4 MG disintegrating tablet  Every 8 hours PRN     05/27/18 0524           Montine Circle, PA-C 05/27/18 0525    Horton, Barbette Hair, MD 05/28/18 [phone]

## 2018-11-21 ENCOUNTER — Ambulatory Visit: Payer: 59 | Admitting: Physician Assistant

## 2018-11-24 ENCOUNTER — Other Ambulatory Visit: Payer: Self-pay

## 2018-11-24 ENCOUNTER — Ambulatory Visit: Payer: 59 | Admitting: Physician Assistant

## 2018-11-24 ENCOUNTER — Encounter: Payer: Self-pay | Admitting: Physician Assistant

## 2018-11-24 VITALS — BP 120/84 | HR 88 | Temp 98.6°F | Resp 16 | Ht 63.0 in | Wt 226.0 lb

## 2018-11-24 DIAGNOSIS — G4733 Obstructive sleep apnea (adult) (pediatric): Secondary | ICD-10-CM

## 2018-11-24 DIAGNOSIS — M25511 Pain in right shoulder: Secondary | ICD-10-CM | POA: Diagnosis not present

## 2018-11-24 DIAGNOSIS — K219 Gastro-esophageal reflux disease without esophagitis: Secondary | ICD-10-CM

## 2018-11-24 DIAGNOSIS — M25521 Pain in right elbow: Secondary | ICD-10-CM | POA: Diagnosis not present

## 2018-11-24 DIAGNOSIS — R7303 Prediabetes: Secondary | ICD-10-CM | POA: Diagnosis not present

## 2018-11-24 DIAGNOSIS — G8929 Other chronic pain: Secondary | ICD-10-CM

## 2018-11-24 MED ORDER — NAPROXEN 500 MG PO TBEC: 500.0000 mg | DELAYED_RELEASE_TABLET | Freq: Two times a day (BID) | ORAL | 0 refills | Status: DC

## 2018-11-24 NOTE — Patient Instructions (Signed)
-   Please take the Naproxen twice daily with food over the next week to 10 days. - Tylenol for breakthrough pain. - Ok to continue the voltaren gel - Avoid heavy lifting and overexertion.  - You will be contacted by Sports Medicine for further management of these ongoing issues.

## 2018-11-24 NOTE — Progress Notes (Signed)
Patient presents to clinic today to establish care.  Acute Concerns: Right shoulder and elbow pain -- associated with back pain, sharp/stabbing pain in upper per back, radiates through sides. Intermittent, worsens with activities such as cooking meals, laundry. Worsening over past 4-6 months. Denies cervical pain. Has history of arthritis in bilateral thumbs. Endorses weakness in hands R>L, right elbow pain. Takes Naproxen and topical Voltaren for pain, provides minimal relief.   Chronic Issues: GERD -- managed with Omeprazole prn. Endorses reflux episodes every couple of weeks. Denies nausea, vomiting. Notes some abdominal discomfort after breakfast with diarrhea, improves through rest of day. This has been present since her cholecystectomy and is not worsening.   Obstructive sleep apnea -- uses CPAP x 3 years. Has difficulty staying asleep, improves when she uses CPAP. Will wake up thoroughout night if she does not use it.   Prediabetes -- monitors glucose at home occassionally, fasting glucose around 110  Health Maintenance: Immunizations -- patient declines flu vaccine today  Colonoscopy -- reports done in 2015, normal  Mammogram -- never done  PAP -- UTD   Past Medical History:  Diagnosis Date  . GERD (gastroesophageal reflux disease)   . Melanoma ([place]) 1998   right leg, no chemo  . Meningitis   . Pre-diabetes   . Sleep apnea    uses CPAP nightly  . Uterine polyp     Past Surgical History:  Procedure Laterality Date  . cholecystectomy    . CHOLECYSTECTOMY    . DILATATION & CURETTAGE/HYSTEROSCOPY WITH MYOSURE N/A 01/16/2017   Procedure: DILATATION & CURETTAGE/HYSTEROSCOPY WITH MYOSURE;  Surgeon: Sloan Leiter, MD;  Location: [place];  Service: Gynecology;  Laterality: N/A;  . DILATION AND CURETTAGE OF UTERUS    . TONSILLECTOMY      Current Outpatient Medications on File Prior to Visit  Medication Sig Dispense Refill  . cephALEXin (KEFLEX) 500 MG  capsule Take 1 capsule (500 mg total) by mouth 2 (two) times daily. 14 capsule 0  . docusate sodium (COLACE) 100 MG capsule Take 1 capsule (100 mg total) by mouth 2 (two) times daily as needed for moderate constipation. 30 capsule 2  . fluticasone (FLONASE) 50 MCG/ACT nasal spray Place 1 spray daily as needed into the nose for allergies. For congestion    . HYDROcodone-acetaminophen (NORCO/VICODIN) 5-325 MG tablet Take 1-2 tablets by mouth every 6 (six) hours as needed. 10 tablet 0  . naproxen sodium (ALEVE) 220 MG tablet Take 220 mg daily as needed by mouth (for pain).    Marland Kitchen omeprazole (PRILOSEC) 10 MG capsule Take 10 mg 2 (two) times daily as needed by mouth (for heartburn).     . ondansetron (ZOFRAN ODT) 4 MG disintegrating tablet Take 1 tablet (4 mg total) by mouth every 8 (eight) hours as needed. 10 tablet 0  . oxyCODONE-acetaminophen (PERCOCET/ROXICET) 5-325 MG tablet Take 1-2 tablets by mouth every 6 (six) hours as needed. 20 tablet 0   No current facility-administered medications on file prior to visit.     Allergies  Allergen Reactions  . Etodolac Hives    No family history on file.  Social History   Socioeconomic History  . Marital status: Married    Spouse name: Not on file  . Number of children: Not on file  . Years of education: Not on file  . Highest education level: Not on file  Occupational History  . Not on file  Social Needs  . Financial resource strain: Not on  file  . Food insecurity    Worry: Not on file    Inability: Not on file  . Transportation needs    Medical: Not on file    Non-medical: Not on file  Tobacco Use  . Smoking status: Never Smoker  . Smokeless tobacco: Never Used  Substance and Sexual Activity  . Alcohol use: Yes  . Drug use: No  . Sexual activity: Never    Birth control/protection: None  Lifestyle  . Physical activity    Days per week: Not on file    Minutes per session: Not on file  . Stress: Not on file  Relationships  .  Social Herbalist on phone: Not on file    Gets together: Not on file    Attends religious service: Not on file    Active member of club or organization: Not on file    Attends meetings of clubs or organizations: Not on file    Relationship status: Not on file  . Intimate partner violence    Fear of current or ex partner: Not on file    Emotionally abused: Not on file    Physically abused: Not on file    Forced sexual activity: Not on file  Other Topics Concern  . Not on file  Social History Narrative  . Not on file    Review of Systems  Constitutional: Negative for chills and fever.  HENT: Negative for hearing loss and tinnitus.   Eyes: Negative for blurred vision and double vision.  Respiratory: Negative for cough and shortness of breath.   Cardiovascular: Positive for palpitations (occasional fluttering, chronic ). Negative for chest pain.  Gastrointestinal: Positive for diarrhea and heartburn. Negative for abdominal pain, blood in stool, constipation, melena, nausea and vomiting.  Genitourinary: Negative for dysuria and frequency.  Musculoskeletal: Positive for back pain. Negative for falls and neck pain.  Neurological: Negative for dizziness, loss of consciousness and headaches.  Psychiatric/Behavioral: Negative for depression and suicidal ideas. The patient has insomnia (secondary to OSA). The patient is not nervous/anxious.    LMP 12/14/2008 (Approximate)   Physical Exam Vitals signs reviewed.  Constitutional:      Appearance: Normal appearance.  HENT:     Head: Normocephalic and atraumatic.  Neck:     Musculoskeletal: Neck supple.  Cardiovascular:     Rate and Rhythm: Normal rate and regular rhythm.     Pulses: Normal pulses.     Heart sounds: Normal heart sounds.  Pulmonary:     Effort: Pulmonary effort is normal.     Breath sounds: Normal breath sounds.  Musculoskeletal:     Right shoulder: She exhibits decreased range of motion (2/2 pain; more pain  with internal and external rotation. Able to flex, extend and abduct), pain and decreased strength (slight; 4/5). She exhibits no bony tenderness and no swelling.     Right elbow: She exhibits normal range of motion and no swelling. No radial head, no medial epicondyle, no lateral epicondyle and no olecranon process tenderness noted.     Cervical back: Normal.     Right forearm: She exhibits tenderness. She exhibits no bony tenderness, no swelling, no edema and no deformity.  Neurological:     General: No focal deficit present.     Mental Status: She is alert and oriented to person, place, and time.  Psychiatric:        Mood and Affect: Mood normal.    Assessment/Plan: 1. Chronic right shoulder pain 2.  Chronic elbow pain, right With decreased strength and ROM 2/2 pain. No known injury. Question rotator impingement and some AC arthropathy due to location of pain on examination. Unable to reproduce elbow and forearm pain with palpation, ROM against resistance. Start Naproxen BID. Supportive measures reviewed. Referral to Sports Medicine placed for assessment, MSK [place] and further management due to chronicity of symptoms.  - Ambulatory referral to Sports Medicine - naproxen (EC NAPROSYN) 500 MG EC tablet; Take 1 tablet (500 mg total) by mouth 2 (two) times daily with a meal.  Dispense: 60 tablet; Refill: 0  3. Obstructive sleep apnea (adult) (pediatric) Using CPAP as directed. BP normotensive. Continue CPAP therapy nightly.   4. Prediabetes Positive history. Declines labs today as she wants to get her MSK complaints taken care of first, but is scheduling CPE.   5. Gastroesophageal reflux disease without esophagitis Continue avoidance of trigger foods. GERD diet reviewed. Handout given. Continue PPI PRN.    Leeanne Rio, PA-C

## 2018-12-01 ENCOUNTER — Encounter: Payer: Self-pay | Admitting: Physician Assistant

## 2018-12-02 MED ORDER — OMEPRAZOLE 10 MG PO CPDR: 10.0000 mg | DELAYED_RELEASE_CAPSULE | Freq: Two times a day (BID) | ORAL | 1 refills | Status: AC | PRN

## 2018-12-02 MED ORDER — CELECOXIB 100 MG PO CAPS: 100.0000 mg | ORAL_CAPSULE | Freq: Two times a day (BID) | ORAL | 0 refills | Status: AC

## 2018-12-02 NOTE — Addendum Note (Signed)
Addended by: Brunetta Jeans on: 12/02/2018 04:45 PM   Modules accepted: Orders

## 2018-12-03 ENCOUNTER — Encounter: Payer: Self-pay | Admitting: Family Medicine

## 2018-12-03 ENCOUNTER — Ambulatory Visit: Payer: 59 | Admitting: Family Medicine

## 2018-12-03 ENCOUNTER — Other Ambulatory Visit: Payer: Self-pay

## 2018-12-03 VITALS — BP 142/90 | HR 93 | Ht 63.0 in | Wt 228.0 lb

## 2018-12-03 DIAGNOSIS — M546 Pain in thoracic spine: Secondary | ICD-10-CM | POA: Diagnosis not present

## 2018-12-03 DIAGNOSIS — M7711 Lateral epicondylitis, right elbow: Secondary | ICD-10-CM

## 2018-12-03 MED ORDER — DICLOFENAC SODIUM 1 % EX GEL: 4.0000 g | Freq: Four times a day (QID) | CUTANEOUS | 11 refills | Status: AC

## 2018-12-03 NOTE — Progress Notes (Signed)
Subjective:    I'm seeing this patient as a consultation for:  Raiford Noble, PA.  Note will be routed back to referring provider.  CC: Mid-back and R elbow pain  I, Molly Weber, LAT, ATC, am serving as scribe for Dr. Lynne Leader.  HPI: Pt is a 58 y/o female presenting w/ c/o R elbow pain x 6 months that is also associated w/ back pain.  She reports a sharp/stabbing upper back pain that radiates through her sides and into her R UE.  Aggravating factors include active use of her R UE w/ activities such as cooking and laundry.  She has tried Naproxen and Voltaren gel.  She notes that her pain frequency has increased over the past 6 months.  She states that her back pain is sharp and intermittent in nature and only hurts w/ movement.  She rates her back pain at a 9/10 at it's worst.    Her R elbow pain is located along her posterior forearm and along the R lateral epicondyle that will radiate up to her R shoulder.  She rates her R elbow pain as a 4/10 that she describes as burning and tingling and as a sharp, stabbing 9/10 at it's worst.  Her R elbow pain is aggravated w/ repetitive activity.  She con't to use the Naproxen and Voltaren gel.   She denies any injury history.  Pain is interfering with activities at home such as cooking cleaning and gardening.  Past medical history, Surgical history, Family history not pertinant except as noted below, Social history, Allergies, and medications have been entered into the medical record, reviewed, and no changes needed.   Review of Systems: No headache, visual changes, nausea, vomiting, diarrhea, constipation, dizziness, abdominal pain, skin rash, fevers, chills, night sweats, weight loss, swollen lymph nodes, body aches, joint swelling, muscle aches, chest pain, shortness of breath, mood changes, visual or auditory hallucinations.   Objective:    Vitals:   12/03/18 1045  BP: (!) 142/90  Pulse: 93  SpO2: 98%   General: Well Developed, well  nourished, and in no acute distress.  Neuro/Psych: Alert and oriented x3, extra-ocular muscles intact, able to move all 4 extremities, sensation grossly intact. Skin: Warm and dry, no rashes noted.  Respiratory: Not using accessory muscles, speaking in full sentences, trachea midline.  Cardiovascular: Pulses palpable, no extremity edema. Abdomen: Does not appear distended. MSK:  C-spine: Nontender to cervical midline.  Normal cervical motion. T-spine nontender to thoracic midline.  Tender palpation bilateral thoracic paraspinal musculature and rhomboid worse on the right. Scapular motion normal bilaterally.  Pain reproduced with scapular retraction resisted right. Right elbow normal-appearing normal motion. Tender palpation lateral epicondyle. Pain with resisted wrist extension. Pulses cap refill sensation intact distally.  Lab and Radiology Results CT scan images of abdomen and pelvis from May 2020 personally independently reviewed. Significant relevant for today's visit is some mild degenerative changes at the thoracic spine visible on CT scan. Additionally confirmed ventral hernia per report.   Impression and Recommendations:    Assessment and Plan: 58 y.o. female with  Thoracic and bilateral right worse than left back pain.  Due to periscapular muscle dysfunction.  Plan for home exercise program and physical therapy.  Differential does include thoracic radiculopathy as well.  If not improving would consider x-ray imaging T-spine and possible MRI.  Right elbow pain.  Lateral epicondylitis.  Plan for home exercise program and physical therapy.  Additionally continue diclofenac gel.  Additionally discussed wrist brace or  counterforce brace.  Recheck back if not improving.  Would consider x-ray at next step.  Schedule recheck in 4 weeks.  PDMP not reviewed this encounter. Orders Placed This Encounter  Procedures  . Ambulatory referral to Physical Therapy    Referral Priority:    Routine    Referral Type:   Physical Medicine    Referral Reason:   Specialty Services Required    Requested Specialty:   Physical Therapy   Meds ordered this encounter  Medications  . diclofenac Sodium (VOLTAREN) 1 % GEL    Sig: Apply 4 g topically 4 (four) times daily. To affected joint.    Dispense:  100 g    Refill:  11    Discussed warning signs or symptoms. Please see discharge instructions. Patient expresses understanding.  The above documentation has been reviewed and is accurate and complete Lynne Leader

## 2018-12-03 NOTE — Patient Instructions (Addendum)
Thank you for coming in today. Attend PT.  Consider counter force brace for tennis elbow.  Use the diclofeanc gel.  Recheck in about 4 weeks.   We're moving!  Dr. Clovis Riley new office will be located at [address] on the 1st floor.  This location is across the street from the [employer] and in the same complex as the [facility] and [employer]  Our new office phone number will be [phone].  We anticipate beginning to see patients at the [facility] office in early December 2020.  Please perform the exercise program that we have prepared for you and gone over in detail on a daily basis.  In addition to the handout you were provided you can access your program through: [email or web address]   Your unique program code is:  [email or web address] and [employer]

## 2018-12-16 ENCOUNTER — Other Ambulatory Visit: Payer: Self-pay

## 2018-12-16 ENCOUNTER — Ambulatory Visit (INDEPENDENT_AMBULATORY_CARE_PROVIDER_SITE_OTHER): Payer: 59 | Admitting: Physical Therapy

## 2018-12-16 ENCOUNTER — Encounter: Payer: Self-pay | Admitting: Physical Therapy

## 2018-12-16 DIAGNOSIS — M25521 Pain in right elbow: Secondary | ICD-10-CM

## 2018-12-16 DIAGNOSIS — M546 Pain in thoracic spine: Secondary | ICD-10-CM | POA: Diagnosis not present

## 2018-12-16 NOTE — Patient Instructions (Signed)
Access Code: [email or web address]  URL: [email or web address]  Date: 12/16/2018  Prepared by: Lyndee Hensen   Exercises Standing Wrist Flexion Stretch - 3 reps - 30 hold - 2x daily Standing Shoulder Posterior Capsule Stretch - 3 reps - 30 hold - 2x daily Seated Correct Posture Seated Scapular Retraction - 10 reps - 1 sets - 2x daily

## 2018-12-18 ENCOUNTER — Encounter: Payer: Self-pay | Admitting: Physical Therapy

## 2018-12-18 NOTE — Therapy (Signed)
[facility] [address] [place], [place], [address] Phone: [phone]   Fax:  [phone]  Physical Therapy Evaluation  Patient Details  Name: Tammie Gomez MRN: [id number] Date of Birth: 08-07-1960 Referring Provider (PT): Lynne Leader   Encounter Date: 12/16/2018  PT End of Session - 12/18/18 2218    Visit Number  1    Number of Visits  12    Date for PT Re-Evaluation  01/27/19    Authorization Type  UHC    PT Start Time  1020    PT Stop Time  1058    PT Time Calculation (min)  38 min    Activity Tolerance  Patient tolerated treatment well    Behavior During Therapy  [facility] for tasks assessed/performed       Past Medical History:  Diagnosis Date  . Arthritis   . GERD (gastroesophageal reflux disease)   . Melanoma ([place]) 1998   right leg, no chemo  . Meningitis   . Pre-diabetes   . Sleep apnea    uses CPAP nightly  . Uterine polyp     Past Surgical History:  Procedure Laterality Date  . cholecystectomy    . CHOLECYSTECTOMY  2017  . DILATATION & CURETTAGE/HYSTEROSCOPY WITH MYOSURE N/A 01/16/2017   Procedure: DILATATION & CURETTAGE/HYSTEROSCOPY WITH MYOSURE;  Surgeon: Sloan Leiter, MD;  Location: [facility];  Service: Gynecology;  Laterality: N/A;  . DILATION AND CURETTAGE OF UTERUS    . TONSILLECTOMY  1964    There were no vitals filed for this visit.   Subjective Assessment - 12/18/18 2215    Subjective  States ongoing pain in thoracic region, for a year, but increased lately. Increased pain with using UEs, laundry, cooking, yard work. Pt does not work. Can have spasms that radiate around abdomen. Difficulty sleeping due to pain. ALso has pain in R lateral elbow. Had previous epicondylitis on L.    Limitations  Sitting;Reading;Lifting;Standing;Walking;House hold activities    Patient Stated Goals  Decreased pain    Currently in Pain?  Yes    Pain Score  8     Pain Location  Back    Pain Orientation   Left;Right    Pain Descriptors / Indicators  Aching;Radiating;Dull    Pain Type  Chronic pain    Pain Onset  More than a month ago    Pain Frequency  Intermittent    Aggravating Factors   Increased UE activity, laying down, sleeping, transfers.    Pain Relieving Factors  none stated    Multiple Pain Sites  Yes    Pain Score  6    Pain Location  Elbow    Pain Orientation  Right    Pain Descriptors / Indicators  Aching;Burning    Pain Type  Chronic pain    Pain Onset  More than a month ago    Pain Frequency  Intermittent    Aggravating Factors   wrist ROM, gripping, opeing cans, doors.    Pain Relieving Factors  rest, brace         [facility] PT Assessment - 12/18/18 0001      Assessment   Medical Diagnosis  Thoracic pain, R elbow pain    Referring Provider (PT)  Lynne Leader    Hand Dominance  Right    Prior Therapy  no      Balance Screen   Has the patient fallen in the past 6 months  No  Prior Function   Level of Independence  Independent      Cognition   Overall Cognitive Status  Within Functional Limits for tasks assessed      ROM / Strength   AROM / PROM / Strength  AROM;Strength      AROM   Overall AROM Comments  Mild limitation in shoulder elevation due to pain in thoracic spine, Elbow: WNL, Lumbar: mild limitation for all motions.       Strength   Overall Strength Comments  Hips: 4-/5, Shoulders: 4-/5, scapular: 4-/5 ; Elbow: 4/5       Palpation   Palpation comment  Pain at R lateral epicondyle.                 Objective measurements completed on examination: See above findings.      [facility] Adult PT Treatment/Exercise - 12/18/18 0001      Exercises   Exercises  Lumbar;Elbow      Elbow Exercises   Other elbow exercises  Wrist Flexion stretch 30 sec x3 on R;       Lumbar Exercises: Stretches   Other Lumbar Stretch Exercise  Posterior capsule stretch 30 sec x3;       Lumbar Exercises: Seated   Other Seated Lumbar Exercises  Scap Squeeze  x10;              PT Education - 12/18/18 2218    Education Details  PT POC, Exam findings, HEP    Person(s) Educated  Patient    Methods  Explanation;Demonstration;Handout    Comprehension  Verbalized understanding;Returned demonstration;Need further instruction       PT Short Term Goals - 12/18/18 2220      PT SHORT TERM GOAL #1   Title  Pt to be independent with initial HEP    Time  2    Period  Weeks    Status  New    Target Date  12/30/18      PT SHORT TERM GOAL #2   Title  Pt to report decreased pain in thoracic region to 5/10 with activity    Time  2    Period  Weeks    Status  New    Target Date  12/30/18        PT Long Term Goals - 12/18/18 2221      PT LONG TERM GOAL #1   Title  Pt to be independent with final HEP    Time  6    Period  Weeks    Status  New    Target Date  01/27/19      PT LONG TERM GOAL #2   Title  Pt to report decreased pain in thoracic region to 0-2/10 with activity.    Time  6    Period  Weeks    Status  New    Target Date  01/27/19      PT LONG TERM GOAL #3   Title  Pt to demo ability for full shoulder AROM without pain in thoracic spine, to improve ability for IADLs.    Time  6    Period  Weeks    Status  New    Target Date  01/27/19      PT LONG TERM GOAL #4   Title  Pt to report decreasd pain in R elbow to 0-2/10    Time  6    Period  Weeks    Status  New    Target  Date  01/27/19             Plan - 12/18/18 2226    Clinical Impression Statement  Pt presents with primary complaint of increased pain and muscle spasms in thoracic region. She has decreased ability for repeated UE ROM and functional activity, due to pain. She also has increased pain in R elbow, consistent with epicondylitis. Discussed implications for this with rest, ice, and HEP today. Will continue to treat thoracic pain as primary dx, and elbow secondary. Pt with decreased ability for full functional activities, due to pain, and will benefit  from skilled PT to improve.    Examination-Activity Limitations  Bathing;Reach Overhead;Carry;Self Feeding;Dressing;Stand;Lift    Examination-Participation Restrictions  Meal Prep;Cleaning;Community Activity;Driving;Shop;Laundry    Stability/Clinical Decision Making  Evolving/Moderate complexity    Clinical Decision Making  Moderate    Rehab Potential  Good    PT Frequency  2x / week    PT Duration  6 weeks    PT Treatment/Interventions  ADLs/Self Care Home Management;Cryotherapy;Electrical Stimulation;DME Instruction;Ultrasound;Traction;Moist Heat;Iontophoresis 4mg /ml Dexamethasone;Gait training;Stair training;Functional mobility training;Therapeutic activities;Therapeutic exercise;Balance training;Neuromuscular re-education;Manual techniques;Orthotic Fit/Training;Patient/family education;Passive range of motion;Dry needling;Taping;Joint Manipulations    Consulted and Agree with Plan of Care  Patient       Patient will benefit from skilled therapeutic intervention in order to improve the following deficits and impairments:  Decreased range of motion, Impaired UE functional use, Increased muscle spasms, Pain, Decreased activity tolerance, Impaired flexibility, Improper body mechanics, Decreased strength, Decreased mobility  Visit Diagnosis: Pain in thoracic spine  Pain in right elbow     Problem List Patient Active Problem List   Diagnosis Date Noted  . Obstructive sleep apnea (adult) (pediatric) 06/15/2015  . Primary osteoarthritis, right hand 03/23/2015  . Prediabetes 11/23/2014  . Left retinal defect 08/09/2011  . Other specified postprocedural states 08/09/2011  . Cervical radiculitis 09/05/2005  . Esophageal spasm 09/05/2005  . GERD (gastroesophageal reflux disease) 09/05/2005  . Other specified anxiety disorders 09/05/2005  . Cholelithiasis 01/21/2004  . Personal history of malignant melanoma of skin 02/25/2002   Lyndee Hensen, PT, DPT 10:40 PM  12/18/18    [facility] [place], [place], [address] Phone: [phone]   Fax:  [phone]  Name: Berla Bouey MRN: [id number] Date of Birth: [birth year]

## 2018-12-22 ENCOUNTER — Ambulatory Visit (INDEPENDENT_AMBULATORY_CARE_PROVIDER_SITE_OTHER): Payer: 59 | Admitting: Physical Therapy

## 2018-12-22 ENCOUNTER — Other Ambulatory Visit: Payer: Self-pay

## 2018-12-22 ENCOUNTER — Encounter: Payer: 59 | Admitting: Physical Therapy

## 2018-12-22 ENCOUNTER — Encounter: Payer: Self-pay | Admitting: Physical Therapy

## 2018-12-22 DIAGNOSIS — M546 Pain in thoracic spine: Secondary | ICD-10-CM

## 2018-12-22 DIAGNOSIS — M25521 Pain in right elbow: Secondary | ICD-10-CM | POA: Diagnosis not present

## 2018-12-22 NOTE — Therapy (Signed)
[facility] [address] [place], [place], [address] Phone: [phone]   Fax:  [phone]  Physical Therapy Treatment  Patient Details  Name: Tammie Gomez MRN: [id number] Date of Birth: 05-24-1960 Referring Provider (PT): Lynne Leader   Encounter Date: 12/22/2018  PT End of Session - 12/22/18 1030    Visit Number  2    Number of Visits  12    Date for PT Re-Evaluation  01/27/19    Authorization Type  UHC    PT Start Time  1020    PT Stop Time  1100    PT Time Calculation (min)  40 min    Activity Tolerance  Patient tolerated treatment well    Behavior During Therapy  [facility] for tasks assessed/performed       Past Medical History:  Diagnosis Date  . Arthritis   . GERD (gastroesophageal reflux disease)   . Melanoma ([place]) 1998   right leg, no chemo  . Meningitis   . Pre-diabetes   . Sleep apnea    uses CPAP nightly  . Uterine polyp     Past Surgical History:  Procedure Laterality Date  . cholecystectomy    . CHOLECYSTECTOMY  2017  . DILATATION & CURETTAGE/HYSTEROSCOPY WITH MYOSURE N/A 01/16/2017   Procedure: DILATATION & CURETTAGE/HYSTEROSCOPY WITH MYOSURE;  Surgeon: Sloan Leiter, MD;  Location: [facility];  Service: Gynecology;  Laterality: N/A;  . DILATION AND CURETTAGE OF UTERUS    . TONSILLECTOMY  1964    There were no vitals filed for this visit.  Subjective Assessment - 12/22/18 1029    Subjective  Pt states mild improvments in Thoracic pain (L), R elbow is very sore.    Currently in Pain?  Yes    Pain Score  6     Pain Location  Back    Pain Orientation  Right;Left    Pain Descriptors / Indicators  Aching;Tightness;Radiating    Pain Type  Chronic pain    Pain Onset  More than a month ago    Pain Frequency  Intermittent    Pain Score  8    Pain Location  Elbow    Pain Orientation  Right    Pain Descriptors / Indicators  Aching;Burning    Pain Type  Chronic pain                        [facility] Adult PT Treatment/Exercise - 12/22/18 1032      Exercises   Exercises  Lumbar;Elbow      Elbow Exercises   Other elbow exercises  Wrist Flexion stretch 30 sec x3 on R;       Lumbar Exercises: Stretches   Other Lumbar Stretch Exercise  L UT stretch 30 sec x3       Lumbar Exercises: Standing   Row  20 reps    Theraband Level (Row)  Level 2 (Red)      Lumbar Exercises: Seated   Other Seated Lumbar Exercises  --    Other Seated Lumbar Exercises  Shoulder pulley flexion x20;       Modalities   Modalities  Iontophoresis      Iontophoresis   Type of Iontophoresis  Dexamethasone    Location  R lateral elbow    Time  6 hr patch      Manual Therapy   Manual Therapy  Joint mobilization;Soft tissue mobilization;Passive ROM    Manual therapy comments  skilled  palpation and monitoring of soft tissue with dry needling.     Soft tissue mobilization  DTM/IASTM to R lateral elbow and forearm, and L thoracic paraspinals/rhomboid region.     Passive ROM  R wrist flexion stretches,  Bil UT stretches.        Trigger Point Dry Needling - 12/22/18 0001    Consent Given?  Yes    Education Handout Provided  Yes    Muscles Treated Upper Quadrant  Rhomboids;Longissimus    Muscles Treated Wrist/Hand  Extensor carpi radialis longus/brevis    Rhomboids Response  Twitch response elicited;Palpable increased muscle length   L   Longissimus Response  Palpable increased muscle length   L thoracic   Extensor carpi radialis longus/brevis Response  Palpable increased muscle length   R            PT Short Term Goals - 12/18/18 2220      PT SHORT TERM GOAL #1   Title  Pt to be independent with initial HEP    Time  2    Period  Weeks    Status  New    Target Date  12/30/18      PT SHORT TERM GOAL #2   Title  Pt to report decreased pain in thoracic region to 5/10 with activity    Time  2    Period  Weeks    Status  New    Target Date  12/30/18         PT Long Term Goals - 12/18/18 2221      PT LONG TERM GOAL #1   Title  Pt to be independent with final HEP    Time  6    Period  Weeks    Status  New    Target Date  01/27/19      PT LONG TERM GOAL #2   Title  Pt to report decreased pain in thoracic region to 0-2/10 with activity.    Time  6    Period  Weeks    Status  New    Target Date  01/27/19      PT LONG TERM GOAL #3   Title  Pt to demo ability for full shoulder AROM without pain in thoracic spine, to improve ability for IADLs.    Time  6    Period  Weeks    Status  New    Target Date  01/27/19      PT LONG TERM GOAL #4   Title  Pt to report decreasd pain in R elbow to 0-2/10    Time  6    Period  Weeks    Status  New    Target Date  01/27/19              Patient will benefit from skilled therapeutic intervention in order to improve the following deficits and impairments:     Visit Diagnosis: Pain in thoracic spine  Pain in right elbow     Problem List Patient Active Problem List   Diagnosis Date Noted  . Obstructive sleep apnea (adult) (pediatric) 06/15/2015  . Primary osteoarthritis, right hand 03/23/2015  . Prediabetes 11/23/2014  . Left retinal defect 08/09/2011  . Other specified postprocedural states 08/09/2011  . Cervical radiculitis 09/05/2005  . Esophageal spasm 09/05/2005  . GERD (gastroesophageal reflux disease) 09/05/2005  . Other specified anxiety disorders 09/05/2005  . Cholelithiasis 01/21/2004  . Personal history of malignant melanoma of skin 02/25/2002  Lyndee Hensen, PT, DPT 12:42 PM  12/22/18    [facility] [place], [place], [address] Phone: [phone]   Fax:  [phone]  Name: Tammie Gomez MRN: [id number] Date of Birth: 02/02/1960

## 2018-12-24 ENCOUNTER — Other Ambulatory Visit: Payer: Self-pay | Admitting: Physician Assistant

## 2018-12-24 DIAGNOSIS — G8929 Other chronic pain: Secondary | ICD-10-CM

## 2018-12-24 DIAGNOSIS — M25511 Pain in right shoulder: Secondary | ICD-10-CM

## 2018-12-25 ENCOUNTER — Ambulatory Visit (INDEPENDENT_AMBULATORY_CARE_PROVIDER_SITE_OTHER): Payer: 59 | Admitting: Physical Therapy

## 2018-12-25 ENCOUNTER — Other Ambulatory Visit: Payer: Self-pay

## 2018-12-25 ENCOUNTER — Encounter: Payer: 59 | Admitting: Physical Therapy

## 2018-12-25 DIAGNOSIS — M546 Pain in thoracic spine: Secondary | ICD-10-CM

## 2018-12-25 DIAGNOSIS — M25521 Pain in right elbow: Secondary | ICD-10-CM | POA: Diagnosis not present

## 2018-12-25 NOTE — Patient Instructions (Signed)
Access Code: [email or web address]  URL: [email or web address]  Date: 12/25/2018  Prepared by: Lyndee Hensen   Exercises Standing Wrist Flexion Stretch - 3 reps - 30 hold - 2x daily Standing Shoulder Posterior Capsule Stretch - 3 reps - 30 hold - 2x daily Seated Correct Posture Seated Scapular Retraction - 10 reps - 1 sets - 2x daily Supine Shoulder Flexion Extension AAROM with Dowel - 10 reps - 1 sets - 2x daily Supine Shoulder Horizontal Abduction with Dumbbells - 10 reps - 1 sets - 2x daily Scapular Retraction with Resistance - 10 reps - 1-2 sets - 2x daily Supine Transversus Abdominis Bracing - Hands on Stomach - 10 reps - 1 sets - 2-3x daily

## 2018-12-29 ENCOUNTER — Encounter: Payer: 59 | Admitting: Physical Therapy

## 2018-12-29 ENCOUNTER — Encounter: Payer: Self-pay | Admitting: Physical Therapy

## 2018-12-29 ENCOUNTER — Ambulatory Visit (INDEPENDENT_AMBULATORY_CARE_PROVIDER_SITE_OTHER): Payer: 59 | Admitting: Physical Therapy

## 2018-12-29 ENCOUNTER — Other Ambulatory Visit: Payer: Self-pay

## 2018-12-29 DIAGNOSIS — M25521 Pain in right elbow: Secondary | ICD-10-CM | POA: Diagnosis not present

## 2018-12-29 DIAGNOSIS — M546 Pain in thoracic spine: Secondary | ICD-10-CM | POA: Diagnosis not present

## 2018-12-29 NOTE — Therapy (Signed)
[facility] [address] [place], [place], [address] Phone: [phone]   Fax:  [phone]  Physical Therapy Treatment  Patient Details  Name: Tammie Gomez MRN: [id number] Date of Birth: 08-Aug-1960 Referring Provider (PT): Lynne Leader   Encounter Date: 12/25/2018  PT End of Session - 12/29/18 0818    Visit Number  3    Number of Visits  12    Date for PT Re-Evaluation  01/27/19    Authorization Type  UHC    PT Start Time  1020    PT Stop Time  1100    PT Time Calculation (min)  40 min    Activity Tolerance  Patient tolerated treatment well    Behavior During Therapy  [facility] for tasks assessed/performed       Past Medical History:  Diagnosis Date  . Arthritis   . GERD (gastroesophageal reflux disease)   . Melanoma ([place]) 1998   right leg, no chemo  . Meningitis   . Pre-diabetes   . Sleep apnea    uses CPAP nightly  . Uterine polyp     Past Surgical History:  Procedure Laterality Date  . cholecystectomy    . CHOLECYSTECTOMY  2017  . DILATATION & CURETTAGE/HYSTEROSCOPY WITH MYOSURE N/A 01/16/2017   Procedure: DILATATION & CURETTAGE/HYSTEROSCOPY WITH MYOSURE;  Surgeon: Sloan Leiter, MD;  Location: [facility];  Service: Gynecology;  Laterality: N/A;  . DILATION AND CURETTAGE OF UTERUS    . TONSILLECTOMY  1964    There were no vitals filed for this visit.  Subjective Assessment - 12/29/18 0813    Subjective  Pt states mild improvements in elbow. Back is very sore with most movements.    Currently in Pain?  Yes    Pain Score  6     Pain Location  Back    Pain Orientation  Right;Left    Pain Descriptors / Indicators  Aching;Tightness;Radiating    Pain Type  Chronic pain    Pain Onset  More than a month ago    Pain Frequency  Intermittent    Pain Score  5    Pain Location  Elbow    Pain Orientation  Right    Pain Descriptors / Indicators  Aching;Burning    Pain Type  Chronic pain    Pain Onset  More  than a month ago    Pain Frequency  Intermittent                       [facility] Adult PT Treatment/Exercise - 12/29/18 0001      Elbow Exercises   Other elbow exercises  Wrist Flexion stretch 30 sec x3 on R;       Lumbar Exercises: Stretches   Single Knee to Chest Stretch  3 reps;30 seconds    Pelvic Tilt  20 reps      Lumbar Exercises: Standing   Row  20 reps    Theraband Level (Row)  Level 2 (Red)      Lumbar Exercises: Seated   Other Seated Lumbar Exercises  Shoulder pulley flexion x20;       Lumbar Exercises: Supine   Ab Set  20 reps    Clam  20 reps    Bent Knee Raise  15 reps      Modalities   Modalities  Iontophoresis      Iontophoresis   Type of Iontophoresis  Dexamethasone    Location  R lateral  elbow    Time  6 hr patch      Manual Therapy   Soft tissue mobilization  DTM/IASTM to R lateral elbow and forearm,              PT Education - 12/29/18 0817    Education Details  HEP updated    Person(s) Educated  Patient    Methods  Explanation;Demonstration;Tactile cues;Verbal cues;Handout    Comprehension  Verbalized understanding;Returned demonstration;Verbal cues required;Tactile cues required       PT Short Term Goals - 12/18/18 2220      PT SHORT TERM GOAL #1   Title  Pt to be independent with initial HEP    Time  2    Period  Weeks    Status  New    Target Date  12/30/18      PT SHORT TERM GOAL #2   Title  Pt to report decreased pain in thoracic region to 5/10 with activity    Time  2    Period  Weeks    Status  New    Target Date  12/30/18        PT Long Term Goals - 12/18/18 2221      PT LONG TERM GOAL #1   Title  Pt to be independent with final HEP    Time  6    Period  Weeks    Status  New    Target Date  01/27/19      PT LONG TERM GOAL #2   Title  Pt to report decreased pain in thoracic region to 0-2/10 with activity.    Time  6    Period  Weeks    Status  New    Target Date  01/27/19      PT LONG TERM  GOAL #3   Title  Pt to demo ability for full shoulder AROM without pain in thoracic spine, to improve ability for IADLs.    Time  6    Period  Weeks    Status  New    Target Date  01/27/19      PT LONG TERM GOAL #4   Title  Pt to report decreasd pain in R elbow to 0-2/10    Time  6    Period  Weeks    Status  New    Target Date  01/27/19            Plan - 12/29/18 0820    Clinical Impression Statement  Pt with + response from last treatment with manual, DN and ionto with elbow. Manual and ionto continued today. Pt educated on posture and transfers for decreased pain in thoracic region. Started with TA contraction and light stabilization. She does have pain that radiates around torso, could be more rib related.    Examination-Activity Limitations  Bathing;Reach Overhead;Carry;Self Feeding;Dressing;Stand;Lift    Examination-Participation Restrictions  Meal Prep;Cleaning;Community Activity;Driving;Shop;Laundry    Stability/Clinical Decision Making  Evolving/Moderate complexity    Rehab Potential  Good    PT Frequency  2x / week    PT Duration  6 weeks    PT Treatment/Interventions  ADLs/Self Care Home Management;Cryotherapy;Electrical Stimulation;DME Instruction;Ultrasound;Traction;Moist Heat;Iontophoresis 4mg /ml Dexamethasone;Gait training;Stair training;Functional mobility training;Therapeutic activities;Therapeutic exercise;Balance training;Neuromuscular re-education;Manual techniques;Orthotic Fit/Training;Patient/family education;Passive range of motion;Dry needling;Taping;Joint Manipulations    Consulted and Agree with Plan of Care  Patient       Patient will benefit from skilled therapeutic intervention in order to improve the following deficits and impairments:  Decreased range of motion, Impaired UE functional use, Increased muscle spasms, Pain, Decreased activity tolerance, Impaired flexibility, Improper body mechanics, Decreased strength, Decreased mobility  Visit  Diagnosis: Pain in thoracic spine  Pain in right elbow     Problem List Patient Active Problem List   Diagnosis Date Noted  . Obstructive sleep apnea (adult) (pediatric) 06/15/2015  . Primary osteoarthritis, right hand 03/23/2015  . Prediabetes 11/23/2014  . Left retinal defect 08/09/2011  . Other specified postprocedural states 08/09/2011  . Cervical radiculitis 09/05/2005  . Esophageal spasm 09/05/2005  . GERD (gastroesophageal reflux disease) 09/05/2005  . Other specified anxiety disorders 09/05/2005  . Cholelithiasis 01/21/2004  . Personal history of malignant melanoma of skin 02/25/2002    Lyndee Hensen, PT, DPT 8:25 AM  12/29/18    [facility] [place], [place], [address] Phone: [phone]   Fax:  [phone]  Name: Tammie Gomez MRN: [id number] Date of Birth: [birth year]

## 2018-12-29 NOTE — Therapy (Signed)
[facility] [address] [place], [place], [address] Phone: [phone]   Fax:  [phone]  Physical Therapy Treatment  Patient Details  Name: Ananya Sayres MRN: [id number] Date of Birth: October 03, 1960 Referring Provider (PT): Lynne Leader   Encounter Date: 12/29/2018  PT End of Session - 12/29/18 1156    Visit Number  4    Number of Visits  12    Date for PT Re-Evaluation  01/27/19    Authorization Type  UHC    PT Start Time  1020    PT Stop Time  1058    PT Time Calculation (min)  38 min    Activity Tolerance  Patient tolerated treatment well    Behavior During Therapy  [facility] for tasks assessed/performed       Past Medical History:  Diagnosis Date  . Arthritis   . GERD (gastroesophageal reflux disease)   . Melanoma ([place]) 1998   right leg, no chemo  . Meningitis   . Pre-diabetes   . Sleep apnea    uses CPAP nightly  . Uterine polyp     Past Surgical History:  Procedure Laterality Date  . cholecystectomy    . CHOLECYSTECTOMY  2017  . DILATATION & CURETTAGE/HYSTEROSCOPY WITH MYOSURE N/A 01/16/2017   Procedure: DILATATION & CURETTAGE/HYSTEROSCOPY WITH MYOSURE;  Surgeon: Sloan Leiter, MD;  Location: [facility];  Service: Gynecology;  Laterality: N/A;  . DILATION AND CURETTAGE OF UTERUS    . TONSILLECTOMY  1964    There were no vitals filed for this visit.  Subjective Assessment - 12/29/18 1026    Subjective  Pt states soreness in elbow and back yesterday. Feeling ok today. Back continues to "spasm" with certain movments.    Currently in Pain?  Yes    Pain Score  5     Pain Location  Back    Pain Orientation  Right;Left    Pain Descriptors / Indicators  Aching;Tightness;Radiating    Pain Type  Chronic pain    Pain Onset  More than a month ago    Pain Frequency  Intermittent    Pain Score  5    Pain Location  Elbow    Pain Orientation  Right    Pain Descriptors / Indicators  Aching;Burning    Pain  Type  Chronic pain    Pain Onset  More than a month ago    Pain Frequency  Intermittent                       [facility] Adult PT Treatment/Exercise - 12/29/18 1027      Lumbar Exercises: Stretches   Single Knee to Chest Stretch  3 reps;30 seconds      Lumbar Exercises: Seated   Other Seated Lumbar Exercises  Shoulder pulley flexion x20;       Lumbar Exercises: Supine   Ab Set  20 reps    Pelvic Tilt  10 reps    Bent Knee Raise  15 reps               PT Short Term Goals - 12/18/18 2220      PT SHORT TERM GOAL #1   Title  Pt to be independent with initial HEP    Time  2    Period  Weeks    Status  New    Target Date  12/30/18      PT SHORT TERM GOAL #2  Title  Pt to report decreased pain in thoracic region to 5/10 with activity    Time  2    Period  Weeks    Status  New    Target Date  12/30/18        PT Long Term Goals - 12/18/18 2221      PT LONG TERM GOAL #1   Title  Pt to be independent with final HEP    Time  6    Period  Weeks    Status  New    Target Date  01/27/19      PT LONG TERM GOAL #2   Title  Pt to report decreased pain in thoracic region to 0-2/10 with activity.    Time  6    Period  Weeks    Status  New    Target Date  01/27/19      PT LONG TERM GOAL #3   Title  Pt to demo ability for full shoulder AROM without pain in thoracic spine, to improve ability for IADLs.    Time  6    Period  Weeks    Status  New    Target Date  01/27/19      PT LONG TERM GOAL #4   Title  Pt to report decreasd pain in R elbow to 0-2/10    Time  6    Period  Weeks    Status  New    Target Date  01/27/19            Plan - 12/29/18 1157    Clinical Impression Statement  Most all ther ex is painful for thoracic region. UE elevation, reaching, as well as laying flat increases pain. Pain in thoracic spine seems to be variable from day to day. Tolerated treatment for elbow well.    Examination-Activity Limitations  Bathing;Reach  Overhead;Carry;Self Feeding;Dressing;Stand;Lift    Examination-Participation Restrictions  Meal Prep;Cleaning;Community Activity;Driving;Shop;Laundry    Stability/Clinical Decision Making  Evolving/Moderate complexity    Rehab Potential  Good    PT Frequency  2x / week    PT Duration  6 weeks    PT Treatment/Interventions  ADLs/Self Care Home Management;Cryotherapy;Electrical Stimulation;DME Instruction;Ultrasound;Traction;Moist Heat;Iontophoresis 4mg /ml Dexamethasone;Gait training;Stair training;Functional mobility training;Therapeutic activities;Therapeutic exercise;Balance training;Neuromuscular re-education;Manual techniques;Orthotic Fit/Training;Patient/family education;Passive range of motion;Dry needling;Taping;Joint Manipulations    Consulted and Agree with Plan of Care  Patient       Patient will benefit from skilled therapeutic intervention in order to improve the following deficits and impairments:  Decreased range of motion, Impaired UE functional use, Increased muscle spasms, Pain, Decreased activity tolerance, Impaired flexibility, Improper body mechanics, Decreased strength, Decreased mobility  Visit Diagnosis: Pain in thoracic spine  Pain in right elbow     Problem List Patient Active Problem List   Diagnosis Date Noted  . Obstructive sleep apnea (adult) (pediatric) 06/15/2015  . Primary osteoarthritis, right hand 03/23/2015  . Prediabetes 11/23/2014  . Left retinal defect 08/09/2011  . Other specified postprocedural states 08/09/2011  . Cervical radiculitis 09/05/2005  . Esophageal spasm 09/05/2005  . GERD (gastroesophageal reflux disease) 09/05/2005  . Other specified anxiety disorders 09/05/2005  . Cholelithiasis 01/21/2004  . Personal history of malignant melanoma of skin 02/25/2002    Lyndee Hensen, PT, DPT 12:01 PM  12/29/18    [place], [place], [address] Phone: [phone]   Fax:   [phone]  Name: Millicent Shareef MRN: [id number] Date of Birth: 05/11/1960

## 2018-12-31 ENCOUNTER — Ambulatory Visit: Payer: 59 | Admitting: Family Medicine

## 2018-12-31 ENCOUNTER — Encounter: Payer: 59 | Admitting: Physical Therapy

## 2019-01-05 ENCOUNTER — Encounter: Payer: 59 | Admitting: Physical Therapy

## 2019-01-12 ENCOUNTER — Encounter: Payer: 59 | Admitting: Physical Therapy

## 2019-01-15 ENCOUNTER — Ambulatory Visit (INDEPENDENT_AMBULATORY_CARE_PROVIDER_SITE_OTHER): Payer: 59 | Admitting: Physical Therapy

## 2019-01-15 ENCOUNTER — Encounter: Payer: Self-pay | Admitting: Physical Therapy

## 2019-01-15 ENCOUNTER — Other Ambulatory Visit: Payer: Self-pay

## 2019-01-15 DIAGNOSIS — M25521 Pain in right elbow: Secondary | ICD-10-CM | POA: Diagnosis not present

## 2019-01-15 DIAGNOSIS — M546 Pain in thoracic spine: Secondary | ICD-10-CM

## 2019-01-15 NOTE — Therapy (Signed)
[facility] [address] [place], [place], [address] Phone: [phone]   Fax:  [phone]  Physical Therapy Treatment  Patient Details  Name: Tammie Gomez MRN: [id number] Date of Birth: 09-29-1960 Referring Provider (PT): Lynne Leader   Encounter Date: 01/15/2019  PT End of Session - 01/15/19 1227    Visit Number  5    Number of Visits  12    Date for PT Re-Evaluation  01/27/19    Authorization Type  UHC    PT Start Time  1020    PT Stop Time  1102    PT Time Calculation (min)  42 min    Activity Tolerance  Patient tolerated treatment well    Behavior During Therapy  [facility] for tasks assessed/performed       Past Medical History:  Diagnosis Date  . Arthritis   . GERD (gastroesophageal reflux disease)   . Melanoma ([place]) 1998   right leg, no chemo  . Meningitis   . Pre-diabetes   . Sleep apnea    uses CPAP nightly  . Uterine polyp     Past Surgical History:  Procedure Laterality Date  . cholecystectomy    . CHOLECYSTECTOMY  2017  . DILATATION & CURETTAGE/HYSTEROSCOPY WITH MYOSURE N/A 01/16/2017   Procedure: DILATATION & CURETTAGE/HYSTEROSCOPY WITH MYOSURE;  Surgeon: Sloan Leiter, MD;  Location: [facility];  Service: Gynecology;  Laterality: N/A;  . DILATION AND CURETTAGE OF UTERUS    . TONSILLECTOMY  1964    There were no vitals filed for this visit.  Subjective Assessment - 01/15/19 1225    Subjective  Pt states continued pain in back and elbow. Has been doing a lot of work in [place], which is bothersome to both areas. States pain is better in back than a couple months ago, less sharp pains.    Patient Stated Goals  Decreased pain    Currently in Pain?  Yes    Pain Score  6     Pain Location  Back    Pain Orientation  Right;Left    Pain Descriptors / Indicators  Aching    Pain Type  Chronic pain    Pain Onset  More than a month ago    Pain Frequency  Intermittent    Pain Score  5    Pain  Location  Elbow    Pain Orientation  Right    Pain Descriptors / Indicators  Aching;Burning    Pain Type  Chronic pain    Pain Onset  More than a month ago    Pain Frequency  Intermittent                       [facility] Adult PT Treatment/Exercise - 01/15/19 1229      Lumbar Exercises: Stretches   Single Knee to Chest Stretch  --    Lower Trunk Rotation  5 reps;10 seconds      Lumbar Exercises: Standing   Row  20 reps    Theraband Level (Row)  Level 2 (Red)    Other Standing Lumbar Exercises  Low Row x10, Shoulder AROM with education for form, abd x10, scaption x10.       Lumbar Exercises: Seated   Other Seated Lumbar Exercises  Shoulder pulley flexion x20;       Lumbar Exercises: Supine   Ab Set  --    Pelvic Tilt  --    Bent Knee Raise  --  Other Supine Lumbar Exercises  Shoulder AAROM/ Cane x15;     Other Supine Lumbar Exercises  Scap pull outs YTB supine with TA,  Bil shoulder ER YTB with TA      Modalities   Modalities  Ultrasound      Ultrasound   Ultrasound Location  R elbow    Ultrasound Parameters  Phono/Dex 8 min, 1.2w/cm2    Ultrasound Goals  Pain      Manual Therapy   Manual Therapy  Taping    Soft tissue mobilization  DTM/IASTM to R lateral elbow and forearm,     Kinesiotex  Inhibit Muscle      Kinesiotix   Inhibit Muscle   2 I strips R elbow, for decompression/pain               PT Short Term Goals - 12/18/18 2220      PT SHORT TERM GOAL #1   Title  Pt to be independent with initial HEP    Time  2    Period  Weeks    Status  New    Target Date  12/30/18      PT SHORT TERM GOAL #2   Title  Pt to report decreased pain in thoracic region to 5/10 with activity    Time  2    Period  Weeks    Status  New    Target Date  12/30/18        PT Long Term Goals - 12/18/18 2221      PT LONG TERM GOAL #1   Title  Pt to be independent with final HEP    Time  6    Period  Weeks    Status  New    Target Date  01/27/19       PT LONG TERM GOAL #2   Title  Pt to report decreased pain in thoracic region to 0-2/10 with activity.    Time  6    Period  Weeks    Status  New    Target Date  01/27/19      PT LONG TERM GOAL #3   Title  Pt to demo ability for full shoulder AROM without pain in thoracic spine, to improve ability for IADLs.    Time  6    Period  Weeks    Status  New    Target Date  01/27/19      PT LONG TERM GOAL #4   Title  Pt to report decreasd pain in R elbow to 0-2/10    Time  6    Period  Weeks    Status  New    Target Date  01/27/19            Plan - 01/15/19 1411    Clinical Impression Statement  Pt with variable pain in back, is making mild progress, but continues to have pain daily, with standing and use of UEs. Continued treatent for inflammation and pain in R elbow. Expect elbow will continue to respond to treatment. Will discuss f/u with MD next week with pt if back is not improving.    Examination-Activity Limitations  Bathing;Reach Overhead;Carry;Self Feeding;Dressing;Stand;Lift    Examination-Participation Restrictions  Meal Prep;Cleaning;Community Activity;Driving;Shop;Laundry    Stability/Clinical Decision Making  Evolving/Moderate complexity    Rehab Potential  Good    PT Frequency  2x / week    PT Duration  6 weeks    PT Treatment/Interventions  ADLs/Self Care Home Management;Cryotherapy;Electrical Stimulation;DME  Instruction;Ultrasound;Traction;Moist Heat;Iontophoresis 4mg /ml Dexamethasone;Gait training;Stair training;Functional mobility training;Therapeutic activities;Therapeutic exercise;Balance training;Neuromuscular re-education;Manual techniques;Orthotic Fit/Training;Patient/family education;Passive range of motion;Dry needling;Taping;Joint Manipulations    Consulted and Agree with Plan of Care  Patient       Patient will benefit from skilled therapeutic intervention in order to improve the following deficits and impairments:  Decreased range of motion, Impaired UE  functional use, Increased muscle spasms, Pain, Decreased activity tolerance, Impaired flexibility, Improper body mechanics, Decreased strength, Decreased mobility  Visit Diagnosis: Pain in thoracic spine  Pain in right elbow     Problem List Patient Active Problem List   Diagnosis Date Noted  . Obstructive sleep apnea (adult) (pediatric) 06/15/2015  . Primary osteoarthritis, right hand 03/23/2015  . Prediabetes 11/23/2014  . Left retinal defect 08/09/2011  . Other specified postprocedural states 08/09/2011  . Cervical radiculitis 09/05/2005  . Esophageal spasm 09/05/2005  . GERD (gastroesophageal reflux disease) 09/05/2005  . Other specified anxiety disorders 09/05/2005  . Cholelithiasis 01/21/2004  . Personal history of malignant melanoma of skin 02/25/2002   Lyndee Hensen, PT, DPT 2:14 PM  01/15/19    [facility], [place], [address] Phone: [phone]   Fax:  [phone]  Name: Fifi Beasley MRN: [id number] Date of Birth: [birth year]

## 2019-01-20 ENCOUNTER — Other Ambulatory Visit: Payer: Self-pay

## 2019-01-20 ENCOUNTER — Ambulatory Visit (INDEPENDENT_AMBULATORY_CARE_PROVIDER_SITE_OTHER): Payer: 59 | Admitting: Physical Therapy

## 2019-01-20 ENCOUNTER — Encounter: Payer: Self-pay | Admitting: Physical Therapy

## 2019-01-20 DIAGNOSIS — M546 Pain in thoracic spine: Secondary | ICD-10-CM | POA: Diagnosis not present

## 2019-01-20 DIAGNOSIS — M25521 Pain in right elbow: Secondary | ICD-10-CM

## 2019-01-20 NOTE — Therapy (Signed)
[facility] [address] [place], [place], [address] Phone: [phone]   Fax:  [phone]  Physical Therapy Treatment  Patient Details  Name: Tammie Gomez MRN: [id number] Date of Birth: July 20, 1960 Referring Provider (PT): Lynne Leader   Encounter Date: 01/20/2019  PT End of Session - 01/20/19 1206    Visit Number  6    Number of Visits  12    Date for PT Re-Evaluation  01/27/19    Authorization Type  UHC    PT Start Time  1019    PT Stop Time  1100    PT Time Calculation (min)  41 min    Activity Tolerance  Patient tolerated treatment well    Behavior During Therapy  [facility] for tasks assessed/performed       Past Medical History:  Diagnosis Date  . Arthritis   . GERD (gastroesophageal reflux disease)   . Melanoma ([place]) 1998   right leg, no chemo  . Meningitis   . Pre-diabetes   . Sleep apnea    uses CPAP nightly  . Uterine polyp     Past Surgical History:  Procedure Laterality Date  . cholecystectomy    . CHOLECYSTECTOMY  2017  . DILATATION & CURETTAGE/HYSTEROSCOPY WITH MYOSURE N/A 01/16/2017   Procedure: DILATATION & CURETTAGE/HYSTEROSCOPY WITH MYOSURE;  Surgeon: Sloan Leiter, MD;  Location: [facility];  Service: Gynecology;  Laterality: N/A;  . DILATION AND CURETTAGE OF UTERUS    . TONSILLECTOMY  1964    There were no vitals filed for this visit.  Subjective Assessment - 01/20/19 1204    Subjective  Pt states minimal pain in back, but a lot of pain in her elbow over the weekend. States any activity in kitchen increases elbow pain. 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    Currently in Pain?  Yes    Pain Score  3     Pain Location  Back    Pain Orientation  Right;Left    Pain Descriptors / Indicators  Aching    Pain Type  Chronic pain    Pain Onset  More than a month ago    Pain Frequency  Intermittent    Pain Score  7    Pain Location  Elbow    Pain Orientation  Right    Pain  Descriptors / Indicators  Aching;Burning    Pain Type  Chronic pain    Pain Onset  More than a month ago    Pain Frequency  Intermittent                       [facility] Adult PT Treatment/Exercise - 01/20/19 1023      Elbow Exercises   Other elbow exercises  Wrist Flexion stretch 30 sec x3 on R;     Other elbow exercises  Wrist Ext, [employer] 2 lb x20;       Lumbar Exercises: Stretches   Lower Trunk Rotation  --    Other Lumbar Stretch Exercise  Standing SB stretch at wall 30 sec x3 bil;       Lumbar Exercises: Standing   Row  --    Theraband Level (Row)  --    Other Standing Lumbar Exercises  --      Lumbar Exercises: Seated   Other Seated Lumbar Exercises  Shoulder pulley flexion x20;       Lumbar Exercises: Supine   Other Supine Lumbar Exercises  --  Other Supine Lumbar Exercises  --      Modalities   Modalities  Ultrasound      Ultrasound   Ultrasound Goals  Pain      Iontophoresis   Type of Iontophoresis  Dexamethasone    Location  R lateral elbow    Time  6 hr patch      Manual Therapy   Manual Therapy  --    Manual therapy comments  skilled palpation and monitoring of soft tissue with dry needling.     Soft tissue mobilization  DTM/IASTM to R lateral elbow and forearm; STM to L mid thoracic paraspinal and Mid trap.     Kinesiotex  --      Kinesiotix   Inhibit Muscle   --       Trigger Point Dry Needling - 01/20/19 0001    Consent Given?  Yes    Education Handout Provided  Previously provided    Muscles Treated Wrist/Hand  Extensor carpi radialis longus/brevis    Extensor carpi radialis longus/brevis Response  Palpable increased muscle length             PT Short Term Goals - 12/18/18 2220      PT SHORT TERM GOAL #1   Title  Pt to be independent with initial HEP    Time  2    Period  Weeks    Status  New    Target Date  12/30/18      PT SHORT TERM GOAL #2   Title  Pt to report decreased pain in thoracic region to 5/10 with  activity    Time  2    Period  Weeks    Status  New    Target Date  12/30/18        PT Long Term Goals - 12/18/18 2221      PT LONG TERM GOAL #1   Title  Pt to be independent with final HEP    Time  6    Period  Weeks    Status  New    Target Date  01/27/19      PT LONG TERM GOAL #2   Title  Pt to report decreased pain in thoracic region to 0-2/10 with activity.    Time  6    Period  Weeks    Status  New    Target Date  01/27/19      PT LONG TERM GOAL #3   Title  Pt to demo ability for full shoulder AROM without pain in thoracic spine, to improve ability for IADLs.    Time  6    Period  Weeks    Status  New    Target Date  01/27/19      PT LONG TERM GOAL #4   Title  Pt to report decreasd pain in R elbow to 0-2/10    Time  6    Period  Weeks    Status  New    Target Date  01/27/19            Plan - 01/20/19 1215    Clinical Impression Statement  Pt with variable pain in back. Elbow very sore today. Manual/IASTM, DN, and Ionto all done for pain and inflammation. Added E[employer] for HEP. Back with minimal soreness with palpation today. Plan to progress as tolerated.    Examination-Activity Limitations  Bathing;Reach Overhead;Carry;Self Feeding;Dressing;Stand;Lift    Examination-Participation Restrictions  Meal Prep;Cleaning;Community Activity;Driving;Shop;Laundry    Stability/Clinical Decision  Making  Evolving/Moderate complexity    Rehab Potential  Good    PT Frequency  2x / week    PT Duration  6 weeks    PT Treatment/Interventions  ADLs/Self Care Home Management;Cryotherapy;Electrical Stimulation;DME Instruction;Ultrasound;Traction;Moist Heat;Iontophoresis 4mg /ml Dexamethasone;Gait training;Stair training;Functional mobility training;Therapeutic activities;Therapeutic exercise;Balance training;Neuromuscular re-education;Manual techniques;Orthotic Fit/Training;Patient/family education;Passive range of motion;Dry needling;Taping;Joint Manipulations    Consulted  and Agree with Plan of Care  Patient       Patient will benefit from skilled therapeutic intervention in order to improve the following deficits and impairments:  Decreased range of motion, Impaired UE functional use, Increased muscle spasms, Pain, Decreased activity tolerance, Impaired flexibility, Improper body mechanics, Decreased strength, Decreased mobility  Visit Diagnosis: Pain in thoracic spine  Pain in right elbow     Problem List Patient Active Problem List   Diagnosis Date Noted  . Obstructive sleep apnea (adult) (pediatric) 06/15/2015  . Primary osteoarthritis, right hand 03/23/2015  . Prediabetes 11/23/2014  . Left retinal defect 08/09/2011  . Other specified postprocedural states 08/09/2011  . Cervical radiculitis 09/05/2005  . Esophageal spasm 09/05/2005  . GERD (gastroesophageal reflux disease) 09/05/2005  . Other specified anxiety disorders 09/05/2005  . Cholelithiasis 01/21/2004  . Personal history of malignant melanoma of skin 02/25/2002    Lyndee Hensen, PT, DPT 12:20 PM  01/20/19    [facility] [place], [place], [address] Phone: [phone]   Fax:  [phone]  Name: Tammie Gomez MRN: [id number] Date of Birth: 1960-12-16

## 2019-01-22 ENCOUNTER — Encounter: Payer: 59 | Admitting: Physical Therapy

## 2019-01-27 ENCOUNTER — Encounter: Payer: Self-pay | Admitting: Physical Therapy

## 2019-01-27 ENCOUNTER — Ambulatory Visit (INDEPENDENT_AMBULATORY_CARE_PROVIDER_SITE_OTHER): Payer: 59 | Admitting: Physical Therapy

## 2019-01-27 ENCOUNTER — Other Ambulatory Visit: Payer: Self-pay

## 2019-01-27 DIAGNOSIS — M25521 Pain in right elbow: Secondary | ICD-10-CM

## 2019-01-27 DIAGNOSIS — M546 Pain in thoracic spine: Secondary | ICD-10-CM | POA: Diagnosis not present

## 2019-01-27 NOTE — Therapy (Signed)
[facility] [address] [place], [place], [address] Phone: [phone]   Fax:  [phone]  Physical Therapy Treatment/Re-Cert   Patient Details  Name: Tammie Gomez MRN: [id number] Date of Birth: [birth year] Referring Provider (PT): Lynne Leader   Encounter Date: 01/27/2019  PT End of Session - 01/27/19 1026    Visit Number  7    Number of Visits  20    Date for PT Re-Evaluation  02/24/19    Authorization Type  UHC    PT Start Time  1020    PT Stop Time  1100    PT Time Calculation (min)  40 min    Activity Tolerance  Patient tolerated treatment well    Behavior During Therapy  [facility] for tasks assessed/performed       Past Medical History:  Diagnosis Date  . Arthritis   . GERD (gastroesophageal reflux disease)   . Melanoma ([place]) 1998   right leg, no chemo  . Meningitis   . Pre-diabetes   . Sleep apnea    uses CPAP nightly  . Uterine polyp     Past Surgical History:  Procedure Laterality Date  . cholecystectomy    . CHOLECYSTECTOMY  2017  . DILATATION & CURETTAGE/HYSTEROSCOPY WITH MYOSURE N/A 01/16/2017   Procedure: DILATATION & CURETTAGE/HYSTEROSCOPY WITH MYOSURE;  Surgeon: Sloan Leiter, MD;  Location: [facility];  Service: Gynecology;  Laterality: N/A;  . DILATION AND CURETTAGE OF UTERUS    . TONSILLECTOMY  1964    There were no vitals filed for this visit.  Subjective Assessment - 01/27/19 1024    Subjective  Pt states pain in back and elbow are a bit better today, and for a few days.    Currently in Pain?  Yes    Pain Score  4     Pain Location  Back    Pain Orientation  Right;Left    Pain Descriptors / Indicators  Aching    Pain Type  Chronic pain    Pain Onset  More than a month ago    Pain Frequency  Intermittent    Multiple Pain Sites  Yes    Pain Score  4    Pain Location  Elbow    Pain Orientation  Right    Pain Descriptors / Indicators  Aching;Burning    Pain Type  Chronic pain     Pain Onset  More than a month ago    Pain Frequency  Intermittent                       [facility] Adult PT Treatment/Exercise - 01/27/19 1027      Elbow Exercises   Elbow Flexion  20 reps    Bar Weights/Barbell (Elbow Flexion)  2 lbs    Elbow Extension  15 reps    Theraband Level (Elbow Extension)  Level 2 (Red)    Other elbow exercises  Wrist Flexion stretch 30 sec x3 on R;     Other elbow exercises  Wrist Ext, Eccentrics 2 lb x20; Wrist uln/rad dev 2lb x15;       Lumbar Exercises: Stretches   Other Lumbar Stretch Exercise  --      Lumbar Exercises: Standing   Row  20 reps    Theraband Level (Row)  Level 3 (Green)      Lumbar Exercises: Seated   Other Seated Lumbar Exercises  Shoulder pulley flexion x20;  Modalities   Modalities  Ultrasound      Ultrasound   Ultrasound Goals  --      Iontophoresis   Type of Iontophoresis  --    Location  --    Time  --      Manual Therapy   Manual therapy comments  skilled palpation and monitoring of soft tissue with dry needling.     Soft tissue mobilization  DTM/IASTM to R lateral elbow and forearm; STM to L mid thoracic paraspinal and Mid trap.        Trigger Point Dry Needling - 01/27/19 0001    Consent Given?  Yes    Education Handout Provided  Previously provided    Muscles Treated Upper Quadrant  Rhomboids    Muscles Treated Wrist/Hand  Extensor carpi radialis longus/brevis    Rhomboids Response  Palpable increased muscle length    Extensor carpi radialis longus/brevis Response  Palpable increased muscle length             PT Short Term Goals - 01/27/19 1212      PT SHORT TERM GOAL #1   Title  Pt to be independent with initial HEP    Time  2    Period  Weeks    Status  Achieved    Target Date  12/30/18      PT SHORT TERM GOAL #2   Title  Pt to report decreased pain in thoracic region to 5/10 with activity    Time  2    Period  Weeks    Status  Partially Met    Target Date  12/30/18         PT Long Term Goals - 01/27/19 1212      PT LONG TERM GOAL #1   Title  Pt to be independent with final HEP    Time  6    Period  Weeks    Status  Partially Met      PT LONG TERM GOAL #2   Title  Pt to report decreased pain in thoracic region to 0-2/10 with activity.    Time  6    Period  Weeks    Status  On-going      PT LONG TERM GOAL #3   Title  Pt to demo ability for full shoulder AROM without pain in thoracic spine, to improve ability for IADLs.    Time  6    Period  Weeks    Status  Partially Met      PT LONG TERM GOAL #4   Title  Pt to report decreasd pain in R elbow to 0-2/10    Time  6    Period  Weeks    Status  On-going            Plan - 01/27/19 1326    Clinical Impression Statement  Pain in back and elbow both decreased today, but pain has been variable, depending on actvity. Posture, and UE movement mechanics discussed for cooking and IADLs. Progressed strengthening for wrist and arm today. Elbow still very sore with palpation. Elbow seems to be improving. Back pain is variable, Recommended pt follow up with MD for thoracic pain    Examination-Activity Limitations  Bathing;Reach Overhead;Carry;Self Feeding;Dressing;Stand;Lift    Examination-Participation Restrictions  Meal Prep;Cleaning;Community Activity;Driving;Shop;Laundry    Stability/Clinical Decision Making  Evolving/Moderate complexity    Rehab Potential  Good    PT Frequency  2x / week    PT Duration  6 weeks    PT Treatment/Interventions  ADLs/Self Care Home Management;Cryotherapy;Electrical Stimulation;DME Instruction;Ultrasound;Traction;Moist Heat;Iontophoresis 29m/ml Dexamethasone;Gait training;Stair training;Functional mobility training;Therapeutic activities;Therapeutic exercise;Balance training;Neuromuscular re-education;Manual techniques;Orthotic Fit/Training;Patient/family education;Passive range of motion;Dry needling;Taping;Joint Manipulations    Consulted and Agree with Plan of Care   Patient       Patient will benefit from skilled therapeutic intervention in order to improve the following deficits and impairments:  Decreased range of motion, Impaired UE functional use, Increased muscle spasms, Pain, Decreased activity tolerance, Impaired flexibility, Improper body mechanics, Decreased strength, Decreased mobility  Visit Diagnosis: Pain in thoracic spine  Pain in right elbow     Problem List Patient Active Problem List   Diagnosis Date Noted  . Obstructive sleep apnea (adult) (pediatric) 06/15/2015  . Primary osteoarthritis, right hand 03/23/2015  . Prediabetes 11/23/2014  . Left retinal defect 08/09/2011  . Other specified postprocedural states 08/09/2011  . Cervical radiculitis 09/05/2005  . Esophageal spasm 09/05/2005  . GERD (gastroesophageal reflux disease) 09/05/2005  . Other specified anxiety disorders 09/05/2005  . Cholelithiasis 01/21/2004  . Personal history of malignant melanoma of skin 02/25/2002    LLyndee Hensen PT, DPT 1:29 PM  01/27/19    [place] [address]Phone: [phone]  Fax:  [phone] Name: Tammie EscheteMRN: [id number] of Birth: 1Nov 09, 1962

## 2019-01-29 ENCOUNTER — Ambulatory Visit (INDEPENDENT_AMBULATORY_CARE_PROVIDER_SITE_OTHER): Payer: 59 | Admitting: Physical Therapy

## 2019-01-29 ENCOUNTER — Other Ambulatory Visit: Payer: Self-pay

## 2019-01-29 DIAGNOSIS — M546 Pain in thoracic spine: Secondary | ICD-10-CM

## 2019-01-29 DIAGNOSIS — M25521 Pain in right elbow: Secondary | ICD-10-CM

## 2019-02-02 ENCOUNTER — Encounter: Payer: Self-pay | Admitting: Physical Therapy

## 2019-02-02 NOTE — Therapy (Signed)
[facility] [address] [place], [place], [address] Phone: [phone]   Fax:  [phone]  Physical Therapy Treatment  Patient Details  Name: Tammie Gomez MRN: [id number] Date of Birth: Jun 01, 1960 Referring Provider (PT): Lynne Leader   Encounter Date: 01/29/2019  PT End of Session - 02/02/19 1329    Visit Number  8    Number of Visits  20    Date for PT Re-Evaluation  02/24/19    Authorization Type  UHC    PT Start Time  1941    PT Stop Time  1058    PT Time Calculation (min)  43 min    Activity Tolerance  Patient tolerated treatment well    Behavior During Therapy  [facility] for tasks assessed/performed       Past Medical History:  Diagnosis Date  . Arthritis   . GERD (gastroesophageal reflux disease)   . Melanoma ([place]) 1998   right leg, no chemo  . Meningitis   . Pre-diabetes   . Sleep apnea    uses CPAP nightly  . Uterine polyp     Past Surgical History:  Procedure Laterality Date  . cholecystectomy    . CHOLECYSTECTOMY  2017  . DILATATION & CURETTAGE/HYSTEROSCOPY WITH MYOSURE N/A 01/16/2017   Procedure: DILATATION & CURETTAGE/HYSTEROSCOPY WITH MYOSURE;  Surgeon: Sloan Leiter, MD;  Location: [facility];  Service: Gynecology;  Laterality: N/A;  . DILATION AND CURETTAGE OF UTERUS    . TONSILLECTOMY  1964    There were no vitals filed for this visit.  Subjective Assessment - 02/02/19 1328    Subjective  Pt states mild pain in back today, elbow is sore. Neck is also very sore, thinks she slept wrong    Patient Stated Goals  Decreased pain    Currently in Pain?  Yes    Pain Score  3     Pain Location  Back    Pain Orientation  Right;Left    Pain Descriptors / Indicators  Aching    Pain Type  Chronic pain    Pain Onset  More than a month ago    Pain Frequency  Intermittent    Multiple Pain Sites  Yes    Pain Score  4    Pain Location  Elbow    Pain Orientation  Right    Pain Descriptors /  Indicators  Aching;Burning    Pain Type  Chronic pain    Pain Onset  More than a month ago    Pain Frequency  Intermittent    Pain Score  6    Pain Location  Neck    Pain Orientation  Right    Pain Descriptors / Indicators  Aching;Tightness;Spasm    Pain Type  Acute pain    Pain Onset  In the past 7 days    Pain Frequency  Intermittent                       [facility] Adult PT Treatment/Exercise - 02/02/19 1333      Elbow Exercises   Elbow Flexion  20 reps    Bar Weights/Barbell (Elbow Flexion)  2 lbs    Elbow Extension  --    Theraband Level (Elbow Extension)  --    Other elbow exercises  Wrist Flexion stretch 30 sec x3 on R;     Other elbow exercises  Wrist Ext, Eccentrics 2 lb x20; Wrist uln/rad dev 2lb x15;  Bicep  curl 2lb x20;        Lumbar Exercises: Standing   Row  20 reps    Theraband Level (Row)  Level 3 (Green)      Lumbar Exercises: Seated   Other Seated Lumbar Exercises  Shoulder pulley flexion x20;       Lumbar Exercises: Supine   Other Supine Lumbar Exercises  Shoulder Flex/cane 2lb x15;  SHoulder flex 2lb x10; Horiz abd 2 lb x15;       Modalities   Modalities  Ultrasound      Iontophoresis   Type of Iontophoresis  Dexamethasone    Location  R lateral elbow    Time  6 hr patch      Manual Therapy   Soft tissue mobilization  DTM/IASTM to R lateral elbow and forearm;     Passive ROM  light Stretching and manual distraction for c-spine for pain, 10 sec x8, SOR on R.                PT Short Term Goals - 01/27/19 1212      PT SHORT TERM GOAL #1   Title  Pt to be independent with initial HEP    Time  2    Period  Weeks    Status  Achieved    Target Date  12/30/18      PT SHORT TERM GOAL #2   Title  Pt to report decreased pain in thoracic region to 5/10 with activity    Time  2    Period  Weeks    Status  Partially Met    Target Date  12/30/18        PT Long Term Goals - 01/27/19 1212      PT LONG TERM GOAL #1   Title  Pt  to be independent with final HEP    Time  6    Period  Weeks    Status  Partially Met      PT LONG TERM GOAL #2   Title  Pt to report decreased pain in thoracic region to 0-2/10 with activity.    Time  6    Period  Weeks    Status  On-going      PT LONG TERM GOAL #3   Title  Pt to demo ability for full shoulder AROM without pain in thoracic spine, to improve ability for IADLs.    Time  6    Period  Weeks    Status  Partially Met      PT LONG TERM GOAL #4   Title  Pt to report decreasd pain in R elbow to 0-2/10    Time  6    Period  Weeks    Status  On-going            Plan - 02/02/19 1331    Clinical Impression Statement  Pt neck very sore on R side, in sub occipital region today. Light distraction and stretching done to improve. Pt with minimal pain to palpate thoracic region today. Elbow seems to be improving with ability for activity and decreased pain, but still very tender to palpate and weak. Pt to benefit from progressive strengthening for elbow, forearm, and shoulder on R.    Examination-Activity Limitations  Bathing;Reach Overhead;Carry;Self Feeding;Dressing;Stand;Lift    Examination-Participation Restrictions  Meal Prep;Cleaning;Community Activity;Driving;Shop;Laundry    Stability/Clinical Decision Making  Evolving/Moderate complexity    Rehab Potential  Good    PT Frequency  2x / week  PT Duration  6 weeks    PT Treatment/Interventions  ADLs/Self Care Home Management;Cryotherapy;Electrical Stimulation;DME Instruction;Ultrasound;Traction;Moist Heat;Iontophoresis 46m/ml Dexamethasone;Gait training;Stair training;Functional mobility training;Therapeutic activities;Therapeutic exercise;Balance training;Neuromuscular re-education;Manual techniques;Orthotic Fit/Training;Patient/family education;Passive range of motion;Dry needling;Taping;Joint Manipulations    Consulted and Agree with Plan of Care  Patient       Patient will benefit from skilled therapeutic  intervention in order to improve the following deficits and impairments:  Decreased range of motion, Impaired UE functional use, Increased muscle spasms, Pain, Decreased activity tolerance, Impaired flexibility, Improper body mechanics, Decreased strength, Decreased mobility  Visit Diagnosis: Pain in thoracic spine  Pain in right elbow     Problem List Patient Active Problem List   Diagnosis Date Noted  . Obstructive sleep apnea (adult) (pediatric) 06/15/2015  . Primary osteoarthritis, right hand 03/23/2015  . Prediabetes 11/23/2014  . Left retinal defect 08/09/2011  . Other specified postprocedural states 08/09/2011  . Cervical radiculitis 09/05/2005  . Esophageal spasm 09/05/2005  . GERD (gastroesophageal reflux disease) 09/05/2005  . Other specified anxiety disorders 09/05/2005  . Cholelithiasis 01/21/2004  . Personal history of malignant melanoma of skin 02/25/2002   LLyndee Hensen PT, DPT 1:35 PM  02/02/19    [place] [place] [address]Phone: [phone]  Fax:  [phone] Name: CAlyrica ThurowMRN: [id number] of Birth: 105/29/1962

## 2019-02-03 ENCOUNTER — Encounter: Payer: Self-pay | Admitting: Physical Therapy

## 2019-02-03 ENCOUNTER — Other Ambulatory Visit: Payer: Self-pay

## 2019-02-03 ENCOUNTER — Ambulatory Visit (INDEPENDENT_AMBULATORY_CARE_PROVIDER_SITE_OTHER): Payer: 59 | Admitting: Physical Therapy

## 2019-02-03 DIAGNOSIS — M546 Pain in thoracic spine: Secondary | ICD-10-CM

## 2019-02-03 DIAGNOSIS — M25521 Pain in right elbow: Secondary | ICD-10-CM

## 2019-02-03 NOTE — Therapy (Signed)
[facility] [address] [place], [place], [address] Phone: [phone]   Fax:  [phone]  Physical Therapy Treatment  Patient Details  Name: Tammie Gomez MRN: [id number] Date of Birth: 08-Dec-1960 Referring Provider (PT): Lynne Leader   Encounter Date: 02/03/2019  PT End of Session - 02/03/19 1223    Visit Number  9    Number of Visits  20    Date for PT Re-Evaluation  02/24/19    Authorization Type  UHC    PT Start Time  1021    PT Stop Time  1100    PT Time Calculation (min)  39 min    Activity Tolerance  Patient tolerated treatment well    Behavior During Therapy  [facility] for tasks assessed/performed       Past Medical History:  Diagnosis Date  . Arthritis   . GERD (gastroesophageal reflux disease)   . Melanoma ([place]) 1998   right leg, no chemo  . Meningitis   . Pre-diabetes   . Sleep apnea    uses CPAP nightly  . Uterine polyp     Past Surgical History:  Procedure Laterality Date  . cholecystectomy    . CHOLECYSTECTOMY  2017  . DILATATION & CURETTAGE/HYSTEROSCOPY WITH MYOSURE N/A 01/16/2017   Procedure: DILATATION & CURETTAGE/HYSTEROSCOPY WITH MYOSURE;  Surgeon: Sloan Leiter, MD;  Location: [facility];  Service: Gynecology;  Laterality: N/A;  . DILATION AND CURETTAGE OF UTERUS    . TONSILLECTOMY  1964    There were no vitals filed for this visit.  Subjective Assessment - 02/03/19 1222    Subjective  Pt states "doing ok this week". Elbow and back not as sore, neck a bit better, still sore.    Currently in Pain?  Yes    Pain Score  1     Pain Location  Back    Pain Orientation  Right;Left    Pain Descriptors / Indicators  Aching    Pain Type  Chronic pain    Pain Onset  More than a month ago    Pain Frequency  Intermittent    Pain Score  2    Pain Location  Elbow    Pain Orientation  Right    Pain Descriptors / Indicators  Aching;Burning    Pain Type  Chronic pain    Pain Onset  More than a  month ago    Pain Frequency  Intermittent                       [facility] Adult PT Treatment/Exercise - 02/03/19 1107      Elbow Exercises   Elbow Flexion  20 reps    Bar Weights/Barbell (Elbow Flexion)  2 lbs    Other elbow exercises  --    Other elbow exercises  Wrist Ext, Eccentrics 3 lb x20;        Lumbar Exercises: Standing   Row  20 reps    Theraband Level (Row)  Level 3 (Green)    Other Standing Lumbar Exercises  Shoulder abd, and flex to 90 /120 x15 each;  Wall slides and circles for posture and scap stabs x15 each bil;       Lumbar Exercises: Seated   Other Seated Lumbar Exercises  Shoulder pulley flexion x20;       Lumbar Exercises: Supine   Other Supine Lumbar Exercises  Shoulder Flex/cane 2lb x15;  ; Horiz abd 2 lb x15;  Modalities   Modalities  --      Iontophoresis   Type of Iontophoresis  Dexamethasone    Location  R lateral elbow    Time  6 hr patch      Manual Therapy   Manual Therapy  Joint mobilization    Joint Mobilization  Post and inf mobs for R GHJ.     Soft tissue mobilization  DTM/IASTM to R lateral elbow and forearm; and R deltoid     Passive ROM  For R shoulder flexion                PT Short Term Goals - 01/27/19 1212      PT SHORT TERM GOAL #1   Title  Pt to be independent with initial HEP    Time  2    Period  Weeks    Status  Achieved    Target Date  12/30/18      PT SHORT TERM GOAL #2   Title  Pt to report decreased pain in thoracic region to 5/10 with activity    Time  2    Period  Weeks    Status  Partially Met    Target Date  12/30/18        PT Long Term Goals - 01/27/19 1212      PT LONG TERM GOAL #1   Title  Pt to be independent with final HEP    Time  6    Period  Weeks    Status  Partially Met      PT LONG TERM GOAL #2   Title  Pt to report decreased pain in thoracic region to 0-2/10 with activity.    Time  6    Period  Weeks    Status  On-going      PT LONG TERM GOAL #3   Title   Pt to demo ability for full shoulder AROM without pain in thoracic spine, to improve ability for IADLs.    Time  6    Period  Weeks    Status  Partially Met      PT LONG TERM GOAL #4   Title  Pt to report decreasd pain in R elbow to 0-2/10    Time  6    Period  Weeks    Status  On-going            Plan - 02/03/19 1224    Clinical Impression Statement  Pt notes mild soreness in R shoulder with increased strengthening and activities today. Minimal tightness in joint, good ROM to screen today. Discussed contiuing to be aware of posture with UE activity. Pt with much less pain in elbow with palpation today, does have soreness up into deltoid with manual today. Plan to progress strength as tolerated.    Examination-Activity Limitations  Bathing;Reach Overhead;Carry;Self Feeding;Dressing;Stand;Lift    Examination-Participation Restrictions  Meal Prep;Cleaning;Community Activity;Driving;Shop;Laundry    Stability/Clinical Decision Making  Evolving/Moderate complexity    Rehab Potential  Good    PT Frequency  2x / week    PT Duration  6 weeks    PT Treatment/Interventions  ADLs/Self Care Home Management;Cryotherapy;Electrical Stimulation;DME Instruction;Ultrasound;Traction;Moist Heat;Iontophoresis 65m/ml Dexamethasone;Gait training;Stair training;Functional mobility training;Therapeutic activities;Therapeutic exercise;Balance training;Neuromuscular re-education;Manual techniques;Orthotic Fit/Training;Patient/family education;Passive range of motion;Dry needling;Taping;Joint Manipulations    Consulted and Agree with Plan of Care  Patient       Patient will benefit from skilled therapeutic intervention in order to improve the following deficits and impairments:  Decreased  range of motion, Impaired UE functional use, Increased muscle spasms, Pain, Decreased activity tolerance, Impaired flexibility, Improper body mechanics, Decreased strength, Decreased mobility  Visit Diagnosis: Pain in  thoracic spine  Pain in right elbow     Problem List Patient Active Problem List   Diagnosis Date Noted  . Obstructive sleep apnea (adult) (pediatric) 06/15/2015  . Primary osteoarthritis, right hand 03/23/2015  . Prediabetes 11/23/2014  . Left retinal defect 08/09/2011  . Other specified postprocedural states 08/09/2011  . Cervical radiculitis 09/05/2005  . Esophageal spasm 09/05/2005  . GERD (gastroesophageal reflux disease) 09/05/2005  . Other specified anxiety disorders 09/05/2005  . Cholelithiasis 01/21/2004  . Personal history of malignant melanoma of skin 02/25/2002    Lyndee Hensen, PT, DPT 12:27 PM  02/03/19    [facility] [place], [place], [address] Phone: [phone]   Fax:  [phone]  Name: Rupal Childress MRN: [id number] Date of Birth: 08-22-1960

## 2019-02-05 ENCOUNTER — Ambulatory Visit (INDEPENDENT_AMBULATORY_CARE_PROVIDER_SITE_OTHER): Payer: 59 | Admitting: Physical Therapy

## 2019-02-05 ENCOUNTER — Other Ambulatory Visit: Payer: Self-pay

## 2019-02-05 ENCOUNTER — Encounter: Payer: Self-pay | Admitting: Physical Therapy

## 2019-02-05 DIAGNOSIS — M25521 Pain in right elbow: Secondary | ICD-10-CM

## 2019-02-05 DIAGNOSIS — M546 Pain in thoracic spine: Secondary | ICD-10-CM | POA: Diagnosis not present

## 2019-02-05 NOTE — Therapy (Signed)
[facility] [address] [place], [place], [address] Phone: [phone]   Fax:  [phone]  Physical Therapy Treatment  Patient Details  Name: Tammie Gomez MRN: [id number] Date of Birth: April 12, 1960 Referring Provider (PT): Lynne Leader   Encounter Date: 02/05/2019  PT End of Session - 02/05/19 1108    Visit Number  10    Number of Visits  20    Date for PT Re-Evaluation  02/24/19    Authorization Type  UHC    PT Start Time  1105    PT Stop Time  1145    PT Time Calculation (min)  40 min    Activity Tolerance  Patient tolerated treatment well    Behavior During Therapy  [facility] for tasks assessed/performed       Past Medical History:  Diagnosis Date  . Arthritis   . GERD (gastroesophageal reflux disease)   . Melanoma ([place]) 1998   right leg, no chemo  . Meningitis   . Pre-diabetes   . Sleep apnea    uses CPAP nightly  . Uterine polyp     Past Surgical History:  Procedure Laterality Date  . cholecystectomy    . CHOLECYSTECTOMY  2017  . DILATATION & CURETTAGE/HYSTEROSCOPY WITH MYOSURE N/A 01/16/2017   Procedure: DILATATION & CURETTAGE/HYSTEROSCOPY WITH MYOSURE;  Surgeon: Sloan Leiter, MD;  Location: [facility];  Service: Gynecology;  Laterality: N/A;  . DILATION AND CURETTAGE OF UTERUS    . TONSILLECTOMY  1964    There were no vitals filed for this visit.  Subjective Assessment - 02/05/19 1107    Subjective  Pt states elbow and back both doing better.    Currently in Pain?  Yes    Pain Score  1     Pain Location  Back    Pain Orientation  Right    Pain Descriptors / Indicators  Aching    Pain Type  Chronic pain    Pain Onset  More than a month ago    Pain Frequency  Intermittent    Pain Score  1    Pain Location  Elbow    Pain Orientation  Right    Pain Descriptors / Indicators  Aching;Burning    Pain Type  Chronic pain    Pain Onset  More than a month ago    Pain Frequency  Intermittent                        [facility] Adult PT Treatment/Exercise - 02/05/19 1119      Elbow Exercises   Elbow Flexion  20 reps    Bar Weights/Barbell (Elbow Flexion)  3 lbs    Other elbow exercises  Wrist Ext, Eccentrics 3 lb x20;  Pron/Sup 2lb x20;       Lumbar Exercises: Standing   Row  20 reps    Theraband Level (Row)  Level 3 (Green)    Other Standing Lumbar Exercises  Shoulder abd, and flex to 90 /120 x15 each;  Ball circles at [employer] for scap stabs x25 each bil;      Other Standing Lumbar Exercises  Bil Shoulder ER RTB x20;       Lumbar Exercises: Seated   Other Seated Lumbar Exercises  Overhead press/ shoulder no weight 2x10;       Lumbar Exercises: Supine   Bridge  10 reps    Other Supine Lumbar Exercises  Horiz abd 2 lb x15 with  TA      Iontophoresis   Type of Iontophoresis  Dexamethasone    Location  --    Time  --      Manual Therapy   Manual Therapy  Joint mobilization    Joint Mobilization  Post and inf mobs for R GHJ.     Soft tissue mobilization  DTM/IASTM to R lateral elbow and forearm; and R deltoid     Passive ROM  For R shoulder flexion              PT Education - 02/05/19 1132    Education Details  HEP updated    Person(s) Educated  Patient    Methods  Explanation;Demonstration;Tactile cues;Verbal cues;Handout    Comprehension  Verbalized understanding;Returned demonstration;Verbal cues required;Need further instruction       PT Short Term Goals - 01/27/19 1212      PT SHORT TERM GOAL #1   Title  Pt to be independent with initial HEP    Time  2    Period  Weeks    Status  Achieved    Target Date  12/30/18      PT SHORT TERM GOAL #2   Title  Pt to report decreased pain in thoracic region to 5/10 with activity    Time  2    Period  Weeks    Status  Partially Met    Target Date  12/30/18        PT Long Term Goals - 01/27/19 1212      PT LONG TERM GOAL #1   Title  Pt to be independent with final HEP    Time  6    Period  Weeks     Status  Partially Met      PT LONG TERM GOAL #2   Title  Pt to report decreased pain in thoracic region to 0-2/10 with activity.    Time  6    Period  Weeks    Status  On-going      PT LONG TERM GOAL #3   Title  Pt to demo ability for full shoulder AROM without pain in thoracic spine, to improve ability for IADLs.    Time  6    Period  Weeks    Status  Partially Met      PT LONG TERM GOAL #4   Title  Pt to report decreasd pain in R elbow to 0-2/10    Time  6    Period  Weeks    Status  On-going            Plan - 02/05/19 1244    Clinical Impression Statement  Pt with much decreased pain with palpation of lateral elbow and forearm today. She does have soreness in distal deltoid region, tender to touch. Pt with much improvment in ability for UE strengthening. She tires easily with repeated motions overhead, due to weakness. Pain in elbow and back seem to be much improved this week. Pt improving with postural awareness for IADLS as well. Pt to benefit form continued care. HEP updated today.    Examination-Activity Limitations  Bathing;Reach Overhead;Carry;Self Feeding;Dressing;Stand;Lift    Examination-Participation Restrictions  Meal Prep;Cleaning;Community Activity;Driving;Shop;Laundry    Stability/Clinical Decision Making  Evolving/Moderate complexity    Rehab Potential  Good    PT Frequency  2x / week    PT Duration  6 weeks    PT Treatment/Interventions  ADLs/Self Care Home Management;Cryotherapy;Electrical Stimulation;DME Instruction;Ultrasound;Traction;Moist Heat;Iontophoresis 1m/ml Dexamethasone;Gait training;Stair  training;Functional mobility training;Therapeutic activities;Therapeutic exercise;Balance training;Neuromuscular re-education;Manual techniques;Orthotic Fit/Training;Patient/family education;Passive range of motion;Dry needling;Taping;Joint Manipulations    Consulted and Agree with Plan of Care  Patient       Patient will benefit from skilled therapeutic  intervention in order to improve the following deficits and impairments:  Decreased range of motion, Impaired UE functional use, Increased muscle spasms, Pain, Decreased activity tolerance, Impaired flexibility, Improper body mechanics, Decreased strength, Decreased mobility  Visit Diagnosis: Pain in thoracic spine  Pain in right elbow     Problem List Patient Active Problem List   Diagnosis Date Noted  . Obstructive sleep apnea (adult) (pediatric) 06/15/2015  . Primary osteoarthritis, right hand 03/23/2015  . Prediabetes 11/23/2014  . Left retinal defect 08/09/2011  . Other specified postprocedural states 08/09/2011  . Cervical radiculitis 09/05/2005  . Esophageal spasm 09/05/2005  . GERD (gastroesophageal reflux disease) 09/05/2005  . Other specified anxiety disorders 09/05/2005  . Cholelithiasis 01/21/2004  . Personal history of malignant melanoma of skin 02/25/2002    Lyndee Hensen, PT, DPT 12:48 PM  02/05/19    [facility] [place], [place], [address] Phone: [phone]   Fax:  [phone]  Name: Celeste Tavenner MRN: [id number] Date of Birth: [birth year]

## 2019-02-05 NOTE — Patient Instructions (Signed)
Access Code: [email or web address]  URL: [email or web address]  Date: 02/05/2019  Prepared by: Lyndee Hensen   Exercises Scapular Retraction with Resistance - 10 reps - 1-2 sets - 2x daily Standing Shoulder External Rotation with Resistance - 10 reps - 2 sets - 1x daily Standing Shoulder Scaption - 10 reps - 1 sets - 2x daily Supine Shoulder Horizontal Abduction with Dumbbells - 10 reps - 1 sets - 2x daily Standing Bicep Curls Supinated with Dumbbells - 10 reps - 2 sets                            - 1x daily Supine Transversus Abdominis Bracing - Hands on Stomach - 10 reps - 1 sets - 2-3x daily Supine March - 10 reps - 2 sets - 2x daily Supine Bridge - 10 reps - 2 sets - 1x daily Standing Wrist Flexion Stretch - 3 reps - 30 hold - 2x daily Seated Wrist Extension with Dumbbell - 10 reps - 2 sets - 1x daily Standing Forearm Pronation and Supination with Hammer - 10 reps - 2 sets - 1x daily Shoulder Overhead Press in Abduction with Dumbbells - 10 reps - 1 sets - 1x daily

## 2019-02-10 ENCOUNTER — Other Ambulatory Visit: Payer: Self-pay

## 2019-02-10 ENCOUNTER — Encounter: Payer: Self-pay | Admitting: Physical Therapy

## 2019-02-10 ENCOUNTER — Ambulatory Visit (INDEPENDENT_AMBULATORY_CARE_PROVIDER_SITE_OTHER): Payer: 59 | Admitting: Physical Therapy

## 2019-02-10 DIAGNOSIS — M25521 Pain in right elbow: Secondary | ICD-10-CM

## 2019-02-10 DIAGNOSIS — M546 Pain in thoracic spine: Secondary | ICD-10-CM

## 2019-02-10 NOTE — Therapy (Signed)
[facility] [address] [place], [place], [address] Phone: [phone]   Fax:  [phone]  Physical Therapy Treatment  Patient Details  Name: Tammie Gomez MRN: [id number] Date of Birth: [birth year] Referring Provider (PT): Lynne Leader   Encounter Date: 02/10/2019  PT End of Session - 02/10/19 1118    Visit Number  11    Number of Visits  20    Date for PT Re-Evaluation  02/24/19    Authorization Type  UHC    PT Start Time  1105    PT Stop Time  1144    PT Time Calculation (min)  39 min    Activity Tolerance  Patient tolerated treatment well    Behavior During Therapy  [facility] for tasks assessed/performed       Past Medical History:  Diagnosis Date  . Arthritis   . GERD (gastroesophageal reflux disease)   . Melanoma ([place]) 1998   right leg, no chemo  . Meningitis   . Pre-diabetes   . Sleep apnea    uses CPAP nightly  . Uterine polyp     Past Surgical History:  Procedure Laterality Date  . cholecystectomy    . CHOLECYSTECTOMY  2017  . DILATATION & CURETTAGE/HYSTEROSCOPY WITH MYOSURE N/A 01/16/2017   Procedure: DILATATION & CURETTAGE/HYSTEROSCOPY WITH MYOSURE;  Surgeon: Sloan Leiter, MD;  Location: [facility];  Service: Gynecology;  Laterality: N/A;  . DILATION AND CURETTAGE OF UTERUS    . TONSILLECTOMY  1964    There were no vitals filed for this visit.  Subjective Assessment - 02/10/19 1109    Subjective  Pt states elbow pain still improving. Back has had a few instances of being sore, mostly hurts when trying to lay down, wiht pressure on back, and pain radiates around lateral ribs/torso, and into L shoulder blade.    Currently in Pain?  Yes    Pain Score  3     Pain Location  Back    Pain Orientation  Left    Pain Descriptors / Indicators  Aching    Pain Type  Chronic pain    Pain Onset  More than a month ago    Pain Frequency  Intermittent    Multiple Pain Sites  Yes    Pain Score  1    Pain  Location  Elbow    Pain Orientation  Right    Pain Descriptors / Indicators  Aching;Burning    Pain Type  Chronic pain    Pain Onset  More than a month ago    Pain Frequency  Intermittent                       [facility] Adult PT Treatment/Exercise - 02/10/19 1122      Elbow Exercises   Elbow Flexion  20 reps    Bar Weights/Barbell (Elbow Flexion)  3 lbs    Other elbow exercises  Wrist Ext, Eccentrics 3 lb x20;  Pron/Sup 2lb x20;       Lumbar Exercises: Standing   Row  20 reps    Theraband Level (Row)  Level 3 (Green)    Other Standing Lumbar Exercises  Shoulder abd, and flex to 90 /120 x15 each;      Other Standing Lumbar Exercises  Bil Shoulder ER RTB x20;       Lumbar Exercises: Seated   Other Seated Lumbar Exercises  Overhead press/ shoulder no weight 2x10;  Lumbar Exercises: Supine   Bridge  20 reps    Straight Leg Raise  15 reps    Other Supine Lumbar Exercises  Shoulder flex 2lb with TA x15;       Iontophoresis   Type of Iontophoresis  Dexamethasone      Manual Therapy   Manual Therapy  Joint mobilization    Joint Mobilization  --    Soft tissue mobilization  DTM and TPR to R rhomboid, and thoracic paraspinal.    Passive ROM  --               PT Short Term Goals - 01/27/19 1212      PT SHORT TERM GOAL #1   Title  Pt to be independent with initial HEP    Time  2    Period  Weeks    Status  Achieved    Target Date  12/30/18      PT SHORT TERM GOAL #2   Title  Pt to report decreased pain in thoracic region to 5/10 with activity    Time  2    Period  Weeks    Status  Partially Met    Target Date  12/30/18        PT Long Term Goals - 01/27/19 1212      PT LONG TERM GOAL #1   Title  Pt to be independent with final HEP    Time  6    Period  Weeks    Status  Partially Met      PT LONG TERM GOAL #2   Title  Pt to report decreased pain in thoracic region to 0-2/10 with activity.    Time  6    Period  Weeks    Status  On-going       PT LONG TERM GOAL #3   Title  Pt to demo ability for full shoulder AROM without pain in thoracic spine, to improve ability for IADLs.    Time  6    Period  Weeks    Status  Partially Met      PT LONG TERM GOAL #4   Title  Pt to report decreasd pain in R elbow to 0-2/10    Time  6    Period  Weeks    Status  On-going            Plan - 02/10/19 1158    Clinical Impression Statement  Pt with improving strength and improving ability/endurance for UE strengthening. Rewviewed core strength and posture for low back today. Pt with much soreness with palaption of rhomobid on L today, and with DTM. Pt overall improivng. Still getting radiating rib pain in torso , mostly with sit to supine transfers.    Examination-Activity Limitations  Bathing;Reach Overhead;Carry;Self Feeding;Dressing;Stand;Lift    Examination-Participation Restrictions  Meal Prep;Cleaning;Community Activity;Driving;Shop;Laundry    Stability/Clinical Decision Making  Evolving/Moderate complexity    Rehab Potential  Good    PT Frequency  2x / week    PT Duration  6 weeks    PT Treatment/Interventions  ADLs/Self Care Home Management;Cryotherapy;Electrical Stimulation;DME Instruction;Ultrasound;Traction;Moist Heat;Iontophoresis '4mg'$ /ml Dexamethasone;Gait training;Stair training;Functional mobility training;Therapeutic activities;Therapeutic exercise;Balance training;Neuromuscular re-education;Manual techniques;Orthotic Fit/Training;Patient/family education;Passive range of motion;Dry needling;Taping;Joint Manipulations    Consulted and Agree with Plan of Care  Patient       Patient will benefit from skilled therapeutic intervention in order to improve the following deficits and impairments:  Decreased range of motion, Impaired UE functional use, Increased muscle  spasms, Pain, Decreased activity tolerance, Impaired flexibility, Improper body mechanics, Decreased strength, Decreased mobility  Visit Diagnosis: Pain in  thoracic spine  Pain in right elbow     Problem List Patient Active Problem List   Diagnosis Date Noted  . Obstructive sleep apnea (adult) (pediatric) 06/15/2015  . Primary osteoarthritis, right hand 03/23/2015  . Prediabetes 11/23/2014  . Left retinal defect 08/09/2011  . Other specified postprocedural states 08/09/2011  . Cervical radiculitis 09/05/2005  . Esophageal spasm 09/05/2005  . GERD (gastroesophageal reflux disease) 09/05/2005  . Other specified anxiety disorders 09/05/2005  . Cholelithiasis 01/21/2004  . Personal history of malignant melanoma of skin 02/25/2002    Lyndee Hensen, PT, DPT 12:00 PM  02/10/19    [facility] [place], [place], [address] Phone: [phone]   Fax:  [phone]  Name: Stavroula Rohde MRN: [id number] Date of Birth: 04-Aug-1960

## 2019-02-12 ENCOUNTER — Other Ambulatory Visit: Payer: Self-pay

## 2019-02-12 ENCOUNTER — Ambulatory Visit (INDEPENDENT_AMBULATORY_CARE_PROVIDER_SITE_OTHER): Payer: 59 | Admitting: Physical Therapy

## 2019-02-12 ENCOUNTER — Encounter: Payer: Self-pay | Admitting: Physical Therapy

## 2019-02-12 DIAGNOSIS — M25521 Pain in right elbow: Secondary | ICD-10-CM | POA: Diagnosis not present

## 2019-02-12 DIAGNOSIS — M546 Pain in thoracic spine: Secondary | ICD-10-CM | POA: Diagnosis not present

## 2019-02-12 NOTE — Therapy (Addendum)
[facility] [address] [place], [place], [address] Phone: [phone]   Fax:  [phone]  Physical Therapy Treatment  Patient Details  Name: Tammie Gomez MRN: [id number] Date of Birth: 08/08/1960 Referring Provider (PT): Lynne Leader   Encounter Date: 02/12/2019  PT End of Session - 02/12/19 1140    Visit Number  12    Number of Visits  20    Date for PT Re-Evaluation  02/24/19    Authorization Type  UHC    PT Start Time  1100    PT Stop Time  1150    PT Time Calculation (min)  50 min    Activity Tolerance  Patient tolerated treatment well    Behavior During Therapy  [facility] for tasks assessed/performed       Past Medical History:  Diagnosis Date  . Arthritis   . GERD (gastroesophageal reflux disease)   . Melanoma ([place]) 1998   right leg, no chemo  . Meningitis   . Pre-diabetes   . Sleep apnea    uses CPAP nightly  . Uterine polyp     Past Surgical History:  Procedure Laterality Date  . cholecystectomy    . CHOLECYSTECTOMY  2017  . DILATATION & CURETTAGE/HYSTEROSCOPY WITH MYOSURE N/A 01/16/2017   Procedure: DILATATION & CURETTAGE/HYSTEROSCOPY WITH MYOSURE;  Surgeon: Sloan Leiter, MD;  Location: [facility];  Service: Gynecology;  Laterality: N/A;  . DILATION AND CURETTAGE OF UTERUS    . TONSILLECTOMY  1964    There were no vitals filed for this visit.  Subjective Assessment - 02/12/19 1139    Subjective  Pt states elbow doing better each week. Has been able to do most activities at home without pain. Still having variable pain, (pain today) in L mid thoracic region.    Currently in Pain?  Yes    Pain Score  5     Pain Location  Back    Pain Orientation  Left    Pain Descriptors / Indicators  Aching;Tightness    Pain Type  Chronic pain    Pain Onset  More than a month ago    Pain Frequency  Intermittent    Pain Score  0    Pain Location  Elbow    Pain Score  0    Pain Location  Neck                        [facility] Adult PT Treatment/Exercise - 02/12/19 1130      Elbow Exercises   Elbow Flexion  20 reps    Bar Weights/Barbell (Elbow Flexion)  3 lbs    Other elbow exercises  Wrist Ext, Uln/Rad dev 2 lb x15;  Eccentrics 3 lb x20;  Pron/Sup 2lb x20;       Lumbar Exercises: Standing   Other Standing Lumbar Exercises  Bil Shoulder ER RTB x20;       Modalities   Modalities  Electrical Stimulation;Moist Heat      Moist Heat Therapy   Number Minutes Moist Heat  12 Minutes    Moist Heat Location  Shoulder      Electrical Stimulation   Electrical Stimulation Location  L mid trap/rhomboid    Electrical Stimulation Parameters  Pre-Mod x12 min    Electrical Stimulation Goals  Pain      Manual Therapy   Manual Therapy  Joint mobilization    Manual therapy comments  skilled palpation and monitoring of  soft tissue with dry needling.     Soft tissue mobilization  DTM and TPR to L rhomboid, mid/low trap, and thoracic paraspinal. DTM and IASTM to R lateral elbow               PT Short Term Goals - 01/27/19 1212      PT SHORT TERM GOAL #1   Title  Pt to be independent with initial HEP    Time  2    Period  Weeks    Status  Achieved    Target Date  12/30/18      PT SHORT TERM GOAL #2   Title  Pt to report decreased pain in thoracic region to 5/10 with activity    Time  2    Period  Weeks    Status  Partially Met    Target Date  12/30/18        PT Long Term Goals - 01/27/19 1212      PT LONG TERM GOAL #1   Title  Pt to be independent with final HEP    Time  6    Period  Weeks    Status  Partially Met      PT LONG TERM GOAL #2   Title  Pt to report decreased pain in thoracic region to 0-2/10 with activity.    Time  6    Period  Weeks    Status  On-going      PT LONG TERM GOAL #3   Title  Pt to demo ability for full shoulder AROM without pain in thoracic spine, to improve ability for IADLs.    Time  6    Period  Weeks    Status   Partially Met      PT LONG TERM GOAL #4   Title  Pt to report decreasd pain in R elbow to 0-2/10    Time  6    Period  Weeks    Status  On-going            Plan - 02/12/19 1142    Clinical Impression Statement  Pts thoracic region sore after manual and DN today. Estim and heat done at end of session. Continued to recommend follow up with MD. Would like to see if pt would respond well to manipulation in thoraic region, due to contnued and variable pain. Elbow pain is improving. Has been able to progress strengthening. Will progress as tolerated.    Examination-Activity Limitations  Bathing;Reach Overhead;Carry;Self Feeding;Dressing;Stand;Lift    Examination-Participation Restrictions  Meal Prep;Cleaning;Community Activity;Driving;Shop;Laundry    Stability/Clinical Decision Making  Evolving/Moderate complexity    Rehab Potential  Good    PT Frequency  2x / week    PT Duration  6 weeks    PT Treatment/Interventions  ADLs/Self Care Home Management;Cryotherapy;Electrical Stimulation;DME Instruction;Ultrasound;Traction;Moist Heat;Iontophoresis 75m/ml Dexamethasone;Gait training;Stair training;Functional mobility training;Therapeutic activities;Therapeutic exercise;Balance training;Neuromuscular re-education;Manual techniques;Orthotic Fit/Training;Patient/family education;Passive range of motion;Dry needling;Taping;Joint Manipulations    Consulted and Agree with Plan of Care  Patient       Patient will benefit from skilled therapeutic intervention in order to improve the following deficits and impairments:  Decreased range of motion, Impaired UE functional use, Increased muscle spasms, Pain, Decreased activity tolerance, Impaired flexibility, Improper body mechanics, Decreased strength, Decreased mobility  Visit Diagnosis: Pain in thoracic spine  Pain in right elbow     Problem List Patient Active Problem List   Diagnosis Date Noted  . Obstructive sleep apnea (adult) (pediatric)  06/15/2015  .  Primary osteoarthritis, right hand 03/23/2015  . Prediabetes 11/23/2014  . Left retinal defect 08/09/2011  . Other specified postprocedural states 08/09/2011  . Cervical radiculitis 09/05/2005  . Esophageal spasm 09/05/2005  . GERD (gastroesophageal reflux disease) 09/05/2005  . Other specified anxiety disorders 09/05/2005  . Cholelithiasis 01/21/2004  . Personal history of malignant melanoma of skin 02/25/2002   Lyndee Hensen, PT, DPT 2:44 PM  01/27/20     [facility] [place], [place], [address] Phone: [phone]   Fax:  [phone]  Name: Tammie Gomez MRN: [id number] Date of Birth: 1960/11/04   PHYSICAL THERAPY DISCHARGE SUMMARY  Visits from Start of Care: 12 Plan: Patient agrees to discharge.  Patient goals were partially met. Patient is being discharged due to not returning since the last visit.  ?????     Lyndee Hensen, PT, DPT 2:44 PM  01/27/20

## 2019-02-24 ENCOUNTER — Encounter: Payer: 59 | Admitting: Physical Therapy

## 2019-02-26 ENCOUNTER — Encounter: Payer: 59 | Admitting: Physical Therapy

## 2020-05-16 ENCOUNTER — Telehealth: Payer: Self-pay

## 2020-05-16 NOTE — Telephone Encounter (Signed)
Patient was notified that provider has left, and they did not wish to schedule anything at this time.  

## 2020-11-11 ENCOUNTER — Other Ambulatory Visit: Payer: Self-pay

## 2020-11-11 ENCOUNTER — Encounter: Payer: Self-pay | Admitting: Registered Nurse

## 2020-11-11 ENCOUNTER — Ambulatory Visit: Payer: 59 | Admitting: Registered Nurse

## 2020-11-11 VITALS — BP 151/83 | HR 80 | Temp 98.2°F | Resp 18 | Ht 63.0 in | Wt 217.6 lb

## 2020-11-11 DIAGNOSIS — Z13220 Encounter for screening for lipoid disorders: Secondary | ICD-10-CM

## 2020-11-11 DIAGNOSIS — Z9989 Dependence on other enabling machines and devices: Secondary | ICD-10-CM

## 2020-11-11 DIAGNOSIS — Z130 Encounter for screening for diseases of the blood and blood-forming organs and certain disorders involving the immune mechanism: Secondary | ICD-10-CM

## 2020-11-11 DIAGNOSIS — Z13228 Encounter for screening for other metabolic disorders: Secondary | ICD-10-CM | POA: Diagnosis not present

## 2020-11-11 DIAGNOSIS — E559 Vitamin D deficiency, unspecified: Secondary | ICD-10-CM | POA: Diagnosis not present

## 2020-11-11 DIAGNOSIS — G4733 Obstructive sleep apnea (adult) (pediatric): Secondary | ICD-10-CM | POA: Diagnosis not present

## 2020-11-11 DIAGNOSIS — Z1329 Encounter for screening for other suspected endocrine disorder: Secondary | ICD-10-CM

## 2020-11-11 DIAGNOSIS — K4090 Unilateral inguinal hernia, without obstruction or gangrene, not specified as recurrent: Secondary | ICD-10-CM

## 2020-11-11 DIAGNOSIS — H0230 Blepharochalasis unspecified eye, unspecified eyelid: Secondary | ICD-10-CM

## 2020-11-11 LAB — COMPREHENSIVE METABOLIC PANEL
ALT: 15 U/L (ref 0–35)
AST: 14 U/L (ref 0–37)
Albumin: 4.2 g/dL (ref 3.5–5.2)
Alkaline Phosphatase: 72 U/L (ref 39–117)
BUN: 11 mg/dL (ref 6–23)
CO2: 30 mEq/L (ref 19–32)
Calcium: 9.8 mg/dL (ref 8.4–10.5)
Chloride: 103 mEq/L (ref 96–112)
Creatinine, Ser: 0.78 mg/dL (ref 0.40–1.20)
GFR: 81.62 mL/min (ref >60.00)
Glucose, Bld: 100 mg/dL — ABNORMAL HIGH (ref 70–99)
Potassium: 4.5 mEq/L (ref 3.5–5.1)
Sodium: 140 mEq/L (ref 135–145)
Total Bilirubin: 0.4 mg/dL (ref 0.2–1.2)
Total Protein: 7.4 g/dL (ref 6.0–8.3)

## 2020-11-11 LAB — CBC WITH DIFFERENTIAL/PLATELET
Basophils Absolute: 0.2 10*3/uL — ABNORMAL HIGH (ref 0.0–0.1)
Basophils Relative: 2.8 % (ref 0.0–3.0)
Eosinophils Absolute: 0.1 10*3/uL (ref 0.0–0.7)
Eosinophils Relative: 2 % (ref 0.0–5.0)
HCT: 41.3 % (ref 36.0–46.0)
Hemoglobin: 13.6 g/dL (ref 12.0–15.0)
Lymphocytes Relative: 29.4 % (ref 12.0–46.0)
Lymphs Abs: 2 10*3/uL (ref 0.7–4.0)
MCHC: 32.9 g/dL (ref 30.0–36.0)
MCV: 89.5 fl (ref 78.0–100.0)
Monocytes Absolute: 0.6 10*3/uL (ref 0.1–1.0)
Monocytes Relative: 9 % (ref 3.0–12.0)
Neutro Abs: 3.9 10*3/uL (ref 1.4–7.7)
Neutrophils Relative %: 56.8 % (ref 43.0–77.0)
Platelets: 349 10*3/uL (ref 150.0–400.0)
RBC: 4.62 Mil/uL (ref 3.87–5.11)
RDW: 13.4 % (ref 11.5–15.5)
WBC: 6.8 10*3/uL (ref 4.0–10.5)

## 2020-11-11 LAB — LIPID PANEL
Cholesterol: 213 mg/dL — ABNORMAL HIGH (ref 0–200)
HDL: 62.6 mg/dL (ref >39.00)
LDL Cholesterol: 127 mg/dL — ABNORMAL HIGH (ref 0–99)
NonHDL: 149.93
Total CHOL/HDL Ratio: 3
Triglycerides: 114 mg/dL (ref 0.0–149.0)
VLDL: 22.8 mg/dL (ref 0.0–40.0)

## 2020-11-11 LAB — VITAMIN D 25 HYDROXY (VIT D DEFICIENCY, FRACTURES): VITD: 24.07 ng/mL — ABNORMAL LOW (ref 30.00–100.00)

## 2020-11-11 LAB — B12 AND FOLATE PANEL
Folate: 20.3 ng/mL (ref >5.9)
Vitamin B-12: 666 pg/mL (ref 211–911)

## 2020-11-11 LAB — HEMOGLOBIN A1C: Hgb A1c MFr Bld: 6 % (ref 4.6–6.5)

## 2020-11-11 LAB — TSH: TSH: 0.24 u[IU]/mL — ABNORMAL LOW (ref 0.35–5.50)

## 2020-11-11 NOTE — Patient Instructions (Addendum)
Tammie Gomez -  Doristine Devoid to see you  Referrals placed  Labs will be back this afternoon  I'll call with anything urgent  See you in 6 mo, sooner if you need anything, sooner if labs indicate, sooner with [name]!  Thanks,  Rich     If you have lab work done today you will be contacted with your lab results within the next 2 weeks.  If you have not heard from [place] then please contact us. The fastest way to get your results is to register for My Chart.   IF you received an x-ray today, you will receive an invoice from [facility]. Please contact [facility] at [phone] with questions or concerns regarding your invoice.   IF you received labwork today, you will receive an invoice from [facility]. Please contact [facility] at [phone] with questions or concerns regarding your invoice.   Our billing staff will not be able to assist you with questions regarding bills from these companies.  You will be contacted with the lab results as soon as they are available. The fastest way to get your results is to activate your My Chart account. Instructions are located on the last page of this paperwork. If you have not heard from [place] regarding the results in 2 weeks, please contact this office.

## 2020-11-11 NOTE — Progress Notes (Signed)
Established Patient Office Visit  Subjective:  Patient ID: Tammie Gomez, female    DOB: 01-01-1961  Age: 60 y.o. MRN: [id number]  CC:  Chief Complaint  Patient presents with   Transitions Of Care    Patient states she is here for a TOC. Patient states she would like to discuss a hernia and sleep apnea.    HPI Tammie Gomez presents for visit to est care.  Hernia R inguinal Worsening - after bout of allergies and coughing. No GI symptoms beyond some pain at site. Had not had repair in past.  Sleep apnea Dx 5 years ago with OSA Past insurance had helped with supplies. Has not seen anyone locally for apnea.  Would like to get est.  Eyelids Heavy - sagging Makes her eyes fatigued. Has had consult for plastic surgery in past, would like to pursue again Makes it difficult to keep eyes open, difficult to read She is concerned as to safety.  Caregiver burden Caring for her husband, sheldon, after suffering TBI resulting from assault last year. She reports some anxiety. Limits to exercise and diet control Trying to get back on track. Has participated in group therapy Not interested in medication or solo therapy at this time.   Past Medical History:  Diagnosis Date   Arthritis    GERD (gastroesophageal reflux disease)    Melanoma ([place]) 1998   right leg, no chemo   Meningitis    Pre-diabetes    Sleep apnea    uses CPAP nightly   Uterine polyp     Past Surgical History:  Procedure Laterality Date   cholecystectomy     CHOLECYSTECTOMY  2017   DILATATION & CURETTAGE/HYSTEROSCOPY WITH MYOSURE N/A 01/16/2017   Procedure: DILATATION & CURETTAGE/HYSTEROSCOPY WITH MYOSURE;  Surgeon: Sloan Leiter, MD;  Location: [facility];  Service: Gynecology;  Laterality: N/A;   DILATION AND CURETTAGE OF UTERUS     TONSILLECTOMY  1964    Family History  Problem Relation Age of Onset   Alcohol abuse Mother    Heart attack Father    Stroke Paternal Grandfather      Social History   Socioeconomic History   Marital status: Married    Spouse name: [name]   Number of children: 2   Years of education: Not on file   Highest education level: Not on file  Occupational History   Not on file  Tobacco Use   Smoking status: Never   Smokeless tobacco: Never  Vaping Use   Vaping Use: Never used  Substance and Sexual Activity   Alcohol use: Yes   Drug use: No   Sexual activity: Not Currently    Birth control/protection: None  Other Topics Concern   Not on file  Social History Narrative   Not on file   Social Determinants of Health   Financial Resource Strain: Not on file  Food Insecurity: Not on file  Transportation Needs: Not on file  Physical Activity: Not on file  Stress: Not on file  Social Connections: Not on file  Intimate Partner Violence: Not on file    Outpatient Medications Prior to Visit  Medication Sig Dispense Refill   celecoxib (CELEBREX) 100 MG capsule Take 1 capsule (100 mg total) by mouth 2 (two) times daily. 30 capsule 0   diclofenac Sodium (VOLTAREN) 1 % GEL Apply 4 g topically 4 (four) times daily. To affected joint. 100 g 11   fluticasone (FLONASE) 50 MCG/ACT nasal spray Place 1 spray daily  as needed into the nose for allergies. For congestion     omeprazole (PRILOSEC) 10 MG capsule Take 1 capsule (10 mg total) by mouth 2 (two) times daily as needed (for heartburn). 60 capsule 1   No facility-administered medications prior to visit.    Allergies  Allergen Reactions   Etodolac Hives    ROS Review of Systems  Constitutional: Negative.   HENT: Negative.    Eyes: Negative.   Respiratory: Negative.    Cardiovascular: Negative.   Gastrointestinal: Negative.   Genitourinary: Negative.   Musculoskeletal: Negative.   Skin: Negative.   Neurological: Negative.   Psychiatric/Behavioral: Negative.    All other systems reviewed and are negative.    Objective:    Physical Exam Vitals and nursing note reviewed.   Constitutional:      General: She is not in acute distress.    Appearance: Normal appearance. She is normal weight. She is not ill-appearing, toxic-appearing or diaphoretic.  Eyes:     General: Lids are normal. No allergic shiner, visual field deficit or scleral icterus.    Comments: Excessive skin on upper eyelids  Cardiovascular:     Rate and Rhythm: Normal rate and regular rhythm.     Heart sounds: Normal heart sounds. No murmur heard.   No friction rub. No gallop.  Pulmonary:     Effort: Pulmonary effort is normal. No respiratory distress.     Breath sounds: Normal breath sounds. No stridor. No wheezing, rhonchi or rales.  Chest:     Chest wall: No tenderness.  Skin:    General: Skin is warm and dry.  Neurological:     General: No focal deficit present.     Mental Status: She is alert and oriented to person, place, and time. Mental status is at baseline.  Psychiatric:        Mood and Affect: Mood normal.        Behavior: Behavior normal.        Thought Content: Thought content normal.        Judgment: Judgment normal.    BP (!) 151/83   Pulse 80   Temp 98.2 F (36.8 C) (Temporal)   Resp 18   Ht 5\' 3"  (1.6 m)   Wt 217 lb 9.6 oz (98.7 kg)   LMP 12/14/2008 (Approximate)   SpO2 99%   BMI 38.55 kg/m  Wt Readings from Last 3 Encounters:  11/11/20 217 lb 9.6 oz (98.7 kg)  12/03/18 228 lb (103.4 kg)  11/24/18 226 lb (102.5 kg)     Health Maintenance Due  Topic Date Due   COVID-19 Vaccine (1) Never done   Hepatitis C Screening  Never done   COLONOSCOPY (Pts 45-9yrs Insurance coverage will need to be confirmed)  Never done   MAMMOGRAM  Never done   Zoster Vaccines- Shingrix (1 of 2) Never done   TETANUS/TDAP  11/16/2010   PAP SMEAR-Modifier  12/11/2019   INFLUENZA VACCINE  Never done    There are no preventive care reminders to display for this patient.  No results found for: TSH Lab Results  Component Value Date   WBC 12.2 (H) 05/26/2018   HGB 13.9  05/26/2018   HCT 42.1 05/26/2018   MCV 87.5 05/26/2018   PLT 377 05/26/2018   Lab Results  Component Value Date   NA 138 05/26/2018   K 3.9 05/26/2018   CO2 20 (L) 05/26/2018   GLUCOSE 122 (H) 05/26/2018   BUN 12 05/26/2018   CREATININE 1.11 (  H) 05/26/2018   BILITOT 0.7 05/26/2018   ALKPHOS 70 05/26/2018   AST 22 05/26/2018   ALT 20 05/26/2018   PROT 7.4 05/26/2018   ALBUMIN 4.1 05/26/2018   CALCIUM 9.7 05/26/2018   ANIONGAP 14 05/26/2018   No results found for: CHOL No results found for: HDL No results found for: LDLCALC No results found for: TRIG No results found for: CHOLHDL No results found for: HGBA1C    Assessment & Plan:   Problem List Items Addressed This Visit   None Visit Diagnoses     Right inguinal hernia    -  Primary   Relevant Orders   Ambulatory referral to General Surgery   Excess skin of eyelid, unspecified laterality       Relevant Orders   Ambulatory referral to Plastic Surgery   OSA on CPAP       Relevant Orders   Ambulatory referral to Neurology   Screening for endocrine, metabolic and immunity disorder       Relevant Orders   CBC with Differential/Platelet   Comprehensive metabolic panel   Hemoglobin A1c   TSH   B12 and Folate Panel   Lipid screening       Relevant Orders   Lipid panel   Vitamin D deficiency       Relevant Orders   Vitamin D (25 hydroxy)       No orders of the defined types were placed in this encounter.   Follow-up: Return in about 6 months (around 05/12/2021) for chronic conditions.   PLAN Labs collected. Will follow up with the patient as warranted. Refer to neuro for OSA  Refer to plastic surgery for excess skin on eyelids Refer to gen surg for hernia Follow up in 6 mo, sooner if labs indicate Patient encouraged to call clinic with any questions, comments, or concerns.  Maximiano Coss, NP

## 2020-11-17 ENCOUNTER — Encounter: Payer: Self-pay | Admitting: Neurology

## 2020-11-17 ENCOUNTER — Ambulatory Visit: Payer: 59 | Admitting: Neurology

## 2020-11-17 VITALS — BP 127/85 | HR 85 | Ht 64.0 in | Wt 217.8 lb

## 2020-11-17 DIAGNOSIS — G4733 Obstructive sleep apnea (adult) (pediatric): Secondary | ICD-10-CM

## 2020-11-17 DIAGNOSIS — Z9989 Dependence on other enabling machines and devices: Secondary | ICD-10-CM

## 2020-11-17 NOTE — Progress Notes (Signed)
OrtsSubjective:    Patient ID: Tammie Gomez is a 60 y.o. female.  HPI    Star Age, MD, PhD [facility] [address], [address]O. [place], [place] [address]  Dear Tammie Gomez,   I saw your patient, Tammie Gomez, upon your kind request in my sleep clinic today for initial consultation of her sleep disorder, in particular, evaluation of her prior diagnosis of obstructive sleep apnea.  The patient is unaccompanied today.  As you know, Tammie Gomez is a 60 year old right-handed woman with an underlying medical history of arthritis, reflux disease, history of melanoma, history including genetics, prediabetes, inguinal hernia, and obesity, who was previously diagnosed with obstructive sleep apnea and placed on positive airway pressure treatment.  [place] in [place], and over 5 years ago. Prior test results are not available for my review today, she reports having had a home sleep test at the time.  She that her AHI was 41.5 at the time.  When she was first placed on AutoPap therapy, she was compliant with it, she greatly benefited from it.  Her usage has declined particularly recently because she needs new supplies and her mask does not fit well.  She had a tonsillectomy as a 37-year-old.  Her weight has been stable.  He moved from [place] some 5 years ago.  She lives with her husband and her 79 year old still lives at home.  She has an older daughter who has 1 child and patient raises her daughter's child, her 8 yo granddaughter. Her bedtime is around 9 PM and rise time around 5 AM.  She drinks caffeine in the form of coffee, 1 or 2 cups daily, she is a non-smoker, drinks alcohol very occasionally.  She works from home. I reviewed your office note from 11/11/2020.  Her Epworth sleepiness score is 10 out of 24, fatigue severity score is 39 out of 63. I was able to review her PAP compliance data from 10/16/2020 through 11/14/2020, which is a total of  30 days, during which time she used her machine only 10 days with percent use days greater than 4 hours at 30% only, indicating suboptimal compliance with an average usage for days on treatment of 5 hours and 7 minutes, residual AHI at goal at 1.8/h, leak acceptable with a 95th percentile at 9.5 L/min with a pressure range of 7 cm to 20 cm with EPR.  95th percentile of pressure of 10.4 cm. In the month of September compliance was a little bit better with percent use days greater than 4 hours at 43%.   She had blood work through your office on 11/11/2020 I was able to review the results.  Vitamin D was below normal at 24.07.  She was advised to start an over-the-counter vitamin D supplement.  CBC with differential and platelets was benign, CMP showed benign findings, sodium 140, potassium 4.5, glucose 100, BUN 11, creatinine 0.78, alk phos 72, 6.0, in the prediabetes range.  TSH was below normal at 0.24.  Lipid panel showed total cholesterol of 213, triglycerides 114, HDL 62.6, LDL 127.  Her Past Medical History Is Significant For: Past Medical History:  Diagnosis Date   Arthritis    GERD (gastroesophageal reflux disease)    Melanoma ([place]) 1998   right leg, no chemo   Meningitis    Pre-diabetes    Sleep apnea    uses CPAP nightly   Uterine polyp     Her Past Surgical History Is Significant For: Past Surgical  History:  Procedure Laterality Date   cholecystectomy     CHOLECYSTECTOMY  2017   DILATATION & CURETTAGE/HYSTEROSCOPY WITH MYOSURE N/A 01/16/2017   Procedure: DILATATION & CURETTAGE/HYSTEROSCOPY WITH MYOSURE;  Surgeon: Sloan Leiter, MD;  Location: [facility];  Service: Gynecology;  Laterality: N/A;   DILATION AND CURETTAGE OF UTERUS     TONSILLECTOMY  1964    Her Family History Is Significant For: Family History  Problem Relation Age of Onset   Alcohol abuse Mother    Heart attack Father    Stroke Paternal Grandfather    Sleep apnea Neg Hx     Her Social  History Is Significant For: Social History   Socioeconomic History   Marital status: Married    Spouse name: [name]   Number of children: 2   Years of education: Not on file   Highest education level: Not on file  Occupational History   Not on file  Tobacco Use   Smoking status: Never   Smokeless tobacco: Never  Vaping Use   Vaping Use: Never used  Substance and Sexual Activity   Alcohol use: Yes   Drug use: No   Sexual activity: Not Currently    Birth control/protection: None  Other Topics Concern   Not on file  Social History Narrative   Not on file   Social Determinants of Health   Financial Resource Strain: Not on file  Food Insecurity: Not on file  Transportation Needs: Not on file  Physical Activity: Not on file  Stress: Not on file  Social Connections: Not on file    Her Allergies Are:  Allergies  Allergen Reactions   Etodolac Hives  :   Her Current Medications Are:  Outpatient Encounter Medications as of 11/17/2020  Medication Sig   celecoxib (CELEBREX) 100 MG capsule Take 1 capsule (100 mg total) by mouth 2 (two) times daily.   diclofenac Sodium (VOLTAREN) 1 % GEL Apply 4 g topically 4 (four) times daily. To affected joint.   fluticasone (FLONASE) 50 MCG/ACT nasal spray Place 1 spray daily as needed into the nose for allergies. For congestion   omeprazole (PRILOSEC) 10 MG capsule Take 1 capsule (10 mg total) by mouth 2 (two) times daily as needed (for heartburn).   No facility-administered encounter medications on file as of 11/17/2020.  :   Review of Systems:  Out of a complete 14 point review of systems, all are reviewed and negative with the exception of these symptoms as listed below:  Review of Systems  Neurological:        Pt is here for sleep consult. Pt brought CPAP for todays visit . Pt states she was living in [place] but back in [place] and now she needs to start get a neurologist Pt needs new supplies   ESS:10 Fss:39   Objective:   Neurological Exam  Physical Exam Physical Examination:   Vitals:   11/17/20 0932  BP: 127/85  Pulse: 85    General Examination: The patient is a very pleasant 60 y.o. female in no acute distress. She appears well-developed and well-nourished and well groomed.   HEENT: Normocephalic, atraumatic, pupils are equal, round and reactive to light, extraocular tracking is good without limitation to gaze excursion or nystagmus noted. Hearing is grossly intact. Face is symmetric with normal facial animation. Speech is clear with no dysarthria noted. There is no hypophonia. There is no lip, neck/head, jaw or voice tremor. Neck is supple with full range of passive and  active motion. There are no carotid bruits on auscultation. Oropharynx exam reveals: mild mouth dryness, adequate dental hygiene and moderate airway crowding, due to small airway entry, tonsils absent, Mallampati class III.  Neck circumference of 15-1/4 inches.  She has a mild to moderate overbite.  Chest: Clear to auscultation without wheezing, rhonchi or crackles noted.  Heart: S1+S2+0, regular and normal without murmurs, rubs or gallops noted.   Abdomen: Soft, non-tender and non-distended.  Extremities: There is trace edema around the left ankle.   Skin: Warm and dry without trophic changes noted.   Musculoskeletal: exam reveals no obvious joint deformities.   Neurologically:  Mental status: The patient is awake, alert and oriented in all 4 spheres. Her immediate and remote memory, attention, language skills and fund of knowledge are appropriate. There is no evidence of aphasia, agnosia, apraxia or anomia. Speech is clear with normal prosody and enunciation. Thought process is linear. Mood is normal and affect is normal.  Cranial nerves II - XII are as described above under HEENT exam.  Motor exam: Normal bulk, strength and tone is noted. There is no tremor, fine motor skills and coordination: grossly intact.  Cerebellar  testing: No dysmetria or intention tremor. There is no truncal or gait ataxia.  Sensory exam: intact to light touch in the upper and lower extremities.  Gait, station and balance: She stands easily. No veering to one side is noted. No leaning to one side is noted. Posture is age-appropriate and stance is narrow based. Gait shows normal stride length and normal pace. No problems turning are noted.   Assessment and Plan:  In summary, Tyler Robidoux is a very pleasant 60 y.o.-year old female ith an underlying medical history of arthritis, reflux disease, history of melanoma, history including genetics, prediabetes, inguinal hernia, and obesity, who presents for evaluation of her obstructive sleep apnea which was diagnosed with a home sleep test some 5 years ago when she was still residing in [place].  She was placed on AutoPap therapy.  She reports that she was initially very compliant with treatment and benefited from it.  She has not had any recent supplies and needs to establish with a new DME provider.  She is motivated to continue with treatment as had noted benefit in the past, her apnea score is at home currently when she uses her machine.  I suggested we proceed with a new prescription for supplies, she is open to establishing with any local DME company.  She is advised that if insurance requires an updated test we can certainly proceed with a home sleep test for reevaluation.  She is advised to follow-up routinely in this clinic in about 3 to 4 months to see one of our nurse practitioners, sooner if needed.  We will talk to her if we need to proceed with a home sleep test about date and time of testing.  I answered all her questions today and she was in agreement with our plan.  She is encouraged to stay consistent with treatment on her current machine for now.  She is advised to continue to work on [facility] [employer]. Thank you very much for allowing me to participate in the care of this nice patient.  If I can be of any further assistance to you please do not hesitate to call me at [phone].  Sincerely,   Star Age, MD, PhD

## 2020-11-17 NOTE — Patient Instructions (Signed)
It was nice to meet you today.  I will send a prescription for CPAP supplies to a local DME company.  Please call us in about a week or 2 if you have not heard from anybody about setting up your supply pickup. If the need arises, we can proceed with reevaluation of your sleep apnea with a home sleep test, if insurance mandates it. Please continue using your autoPAP regularly. While your insurance requires that you use PAP at least 4 hours each night on 70% of the nights, I recommend, that you not skip any nights and use it throughout the night if you can. Getting used to PAP and staying with the treatment long term does take time and patience and discipline. Untreated obstructive sleep apnea when it is moderate to severe can have an adverse impact on cardiovascular health and raise her risk for heart disease, arrhythmias, hypertension, congestive heart failure, stroke and diabetes. Untreated obstructive sleep apnea causes sleep disruption, nonrestorative sleep, and sleep deprivation. This can have an impact on your day to day functioning and cause daytime sleepiness and impairment of cognitive function, memory loss, mood disturbance, and problems focussing. Using PAP regularly can improve these symptoms.

## 2020-11-18 ENCOUNTER — Encounter: Payer: Self-pay | Admitting: Registered Nurse

## 2021-01-06 ENCOUNTER — Other Ambulatory Visit: Payer: Self-pay

## 2021-01-06 ENCOUNTER — Encounter: Payer: Self-pay | Admitting: Plastic Surgery

## 2021-01-06 ENCOUNTER — Ambulatory Visit: Payer: 59 | Admitting: Plastic Surgery

## 2021-01-06 VITALS — BP 129/85 | HR 88 | Ht 64.0 in | Wt 221.0 lb

## 2021-01-06 DIAGNOSIS — H02834 Dermatochalasis of left upper eyelid: Secondary | ICD-10-CM

## 2021-01-06 DIAGNOSIS — H02831 Dermatochalasis of right upper eyelid: Secondary | ICD-10-CM | POA: Diagnosis not present

## 2021-01-06 DIAGNOSIS — H57813 Brow ptosis, bilateral: Secondary | ICD-10-CM | POA: Diagnosis not present

## 2021-01-09 NOTE — Progress Notes (Signed)
° °  Referring Provider Maximiano Coss, NP [address] [place],  [place] [address]   CC:  Visual obstruction from upper eyelids and brows.  Tammie Gomez is an 60 y.o. female.  HPI: 60 year old who is interested in discussing upper eyelid surgery.  She notes that she has visual obstruction from her upper eyelids and brows.  This has been progressive over time.  She denies any history of dry eyes symptoms.  Allergies  Allergen Reactions   Etodolac Hives and Rash    Outpatient Encounter Medications as of 01/06/2021  Medication Sig   diclofenac Sodium (VOLTAREN) 1 % GEL Apply 4 g topically 4 (four) times daily. To affected joint.   fluticasone (FLONASE) 50 MCG/ACT nasal spray Place 1 spray daily as needed into the nose for allergies. For congestion   omeprazole (PRILOSEC) 10 MG capsule Take 1 capsule (10 mg total) by mouth 2 (two) times daily as needed (for heartburn).   celecoxib (CELEBREX) 100 MG capsule Take 1 capsule (100 mg total) by mouth 2 (two) times daily.   No facility-administered encounter medications on file as of 01/06/2021.     Past Medical History:  Diagnosis Date   Arthritis    GERD (gastroesophageal reflux disease)    Melanoma ([place]) 1998   right leg, no chemo   Meningitis    Pre-diabetes    Sleep apnea    uses CPAP nightly   Uterine polyp     Past Surgical History:  Procedure Laterality Date   cholecystectomy     CHOLECYSTECTOMY  2017   DILATATION & CURETTAGE/HYSTEROSCOPY WITH MYOSURE N/A 01/16/2017   Procedure: DILATATION & CURETTAGE/HYSTEROSCOPY WITH MYOSURE;  Surgeon: Sloan Leiter, MD;  Location: [facility];  Service: Gynecology;  Laterality: N/A;   DILATION AND CURETTAGE OF UTERUS     TONSILLECTOMY  1964    Family History  Problem Relation Age of Onset   Alcohol abuse Mother    Heart attack Father    Stroke Paternal Grandfather    Sleep apnea Neg Hx     Social History   Social History Narrative   Not on file      Review of Systems General: Denies fevers, chills, weight loss CV: Denies chest pain, shortness of breath, palpitations   Physical Exam Vitals with BMI 01/06/2021 11/17/2020 11/11/2020  Height 5\' 4"  5\' 4"  5\' 3"   Weight 221 lbs 217 lbs 13 oz 217 lbs 10 oz  BMI 37.92 03.50 09.38  Systolic 182 993 716  Diastolic 85 85 83  Pulse 88 85 80    General:  No acute distress,  Alert and oriented, Non-Toxic, Normal speech and affect HEENT: Lid skin resting on the lashes, MRD 3 mm.  Brow skin below the orbital rim with some brow hooding.  Assessment/Plan Patient is a good candidate for a blepharoplasty or a blepharoplasty with a trans-Bleph brow lift.  We discussed other options for brow surgery as well.  Patient is going to obtain a taped and untaped visual field test and then we can submit this to her insurance.  I think there is a good chance she will have functional improvement with upper eyelid and brow surgery.  Time based coding: 31 minutes were spent with the patient.  Greater than 50% was spent on counseling cordination of care.  We discussed options for eyelid and brow surgery.   Lennice Sites 01/09/2021, 12:34 PM

## 2021-02-05 IMAGING — CT CT ABDOMEN AND PELVIS WITH CONTRAST
2 of 5 series · 16 of 46 positions shown, 18 images · IV contrast (Omni 300)
Comparison: None.

CLINICAL DATA: Nausea, vomiting and abdominal pain.

EXAM:
CT ABDOMEN AND PELVIS WITH CONTRAST
TECHNIQUE: Multidetector CT imaging of the abdomen and pelvis was performed
using the standard protocol following bolus administration of
intravenous contrast.
CONTRAST:  100mL OMNIPAQUE IOHEXOL 300 MG/ML  SOLN

[Series 3: a/p w/ 5mm · axial · 0.98mm/px · z∈[+819,+1254]mm · 13 of 97 slices shown, 15 images]
[im 5/97  soft-tissue]
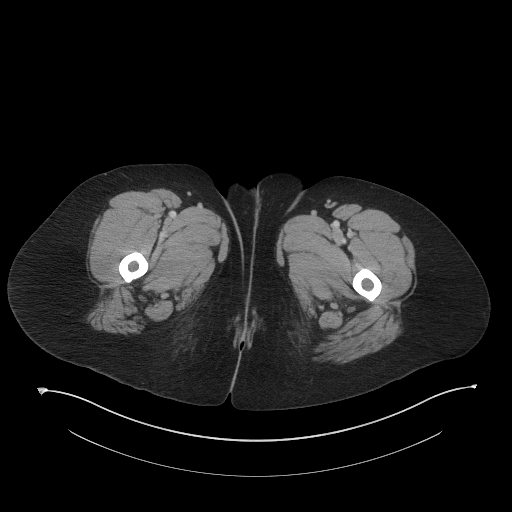
[im 5/97  bone]
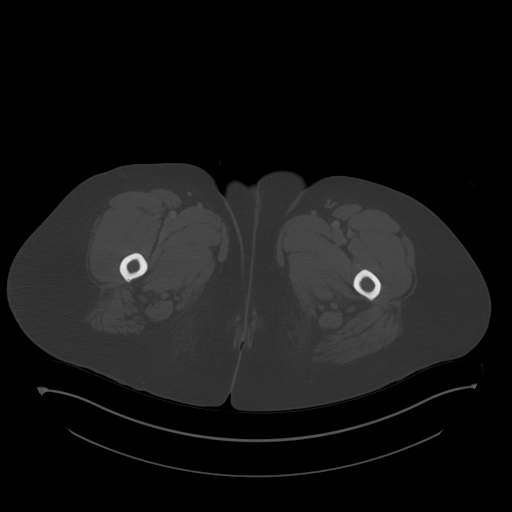
[im 15/97  soft-tissue]
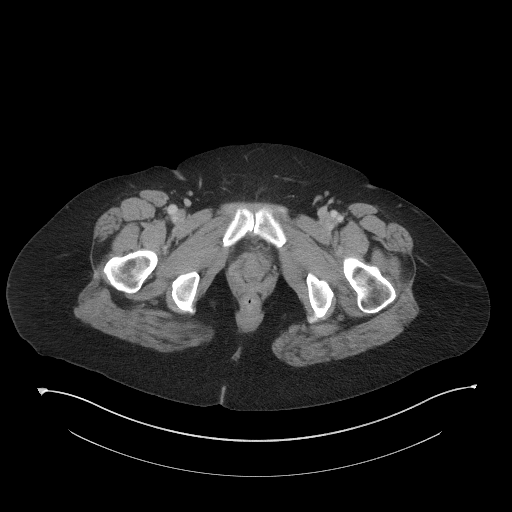
[im 20/97  soft-tissue]
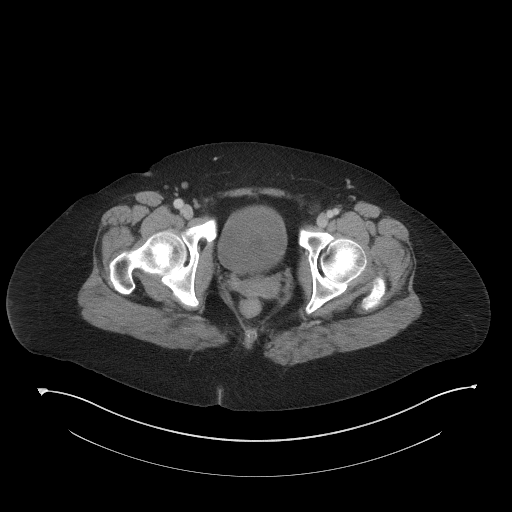
[im 29/97  soft-tissue]
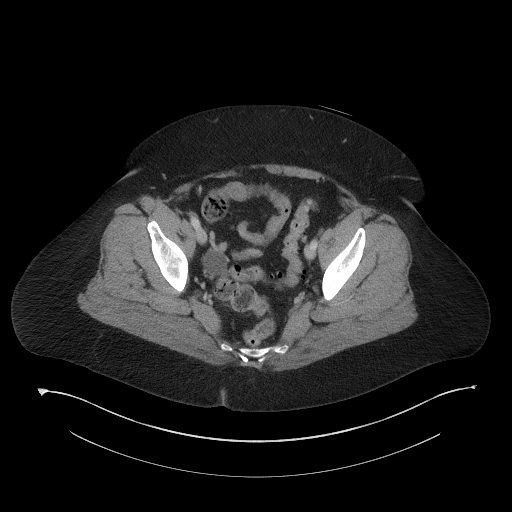
[im 34/97  soft-tissue]
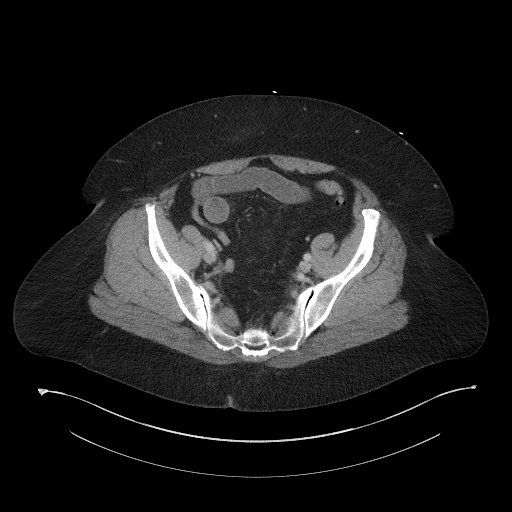
[im 44/97  soft-tissue]
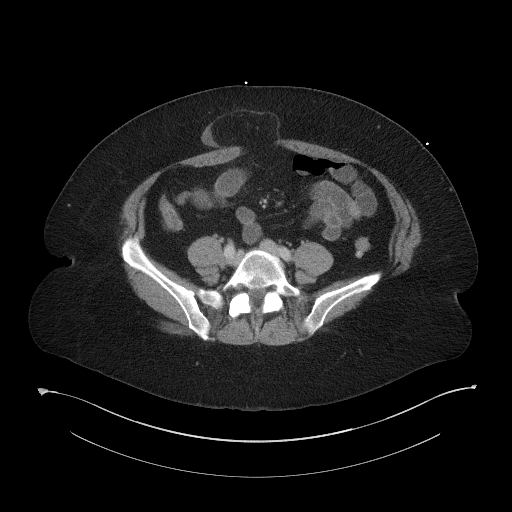
[im 49/97  soft-tissue]
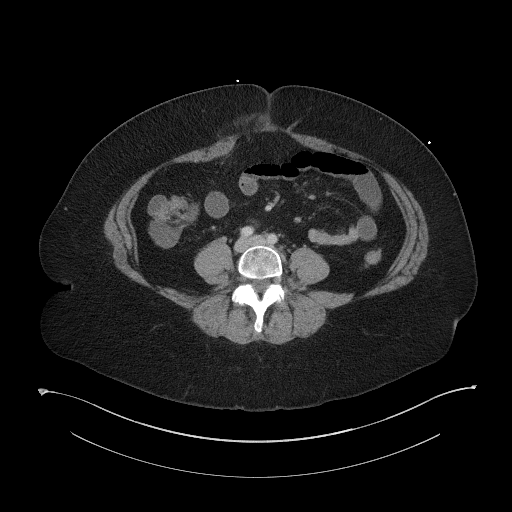
[im 53/97  soft-tissue]
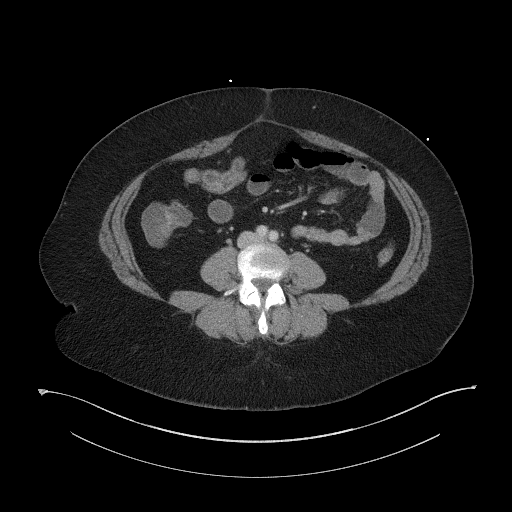
[im 63/97  soft-tissue]
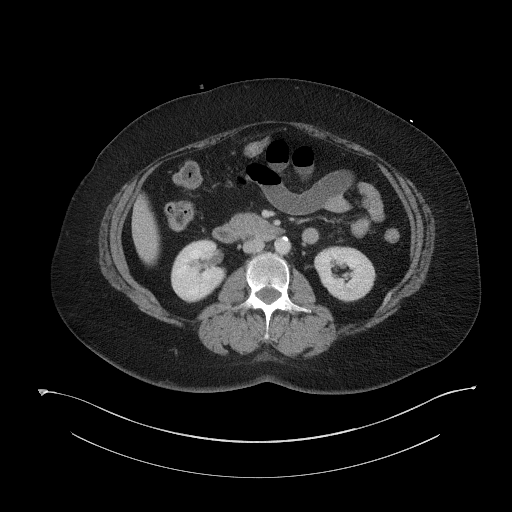
[im 63/97  bone]
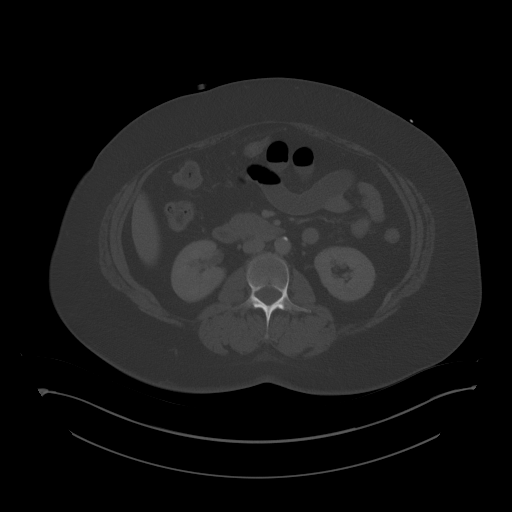
[im 68/97  soft-tissue]
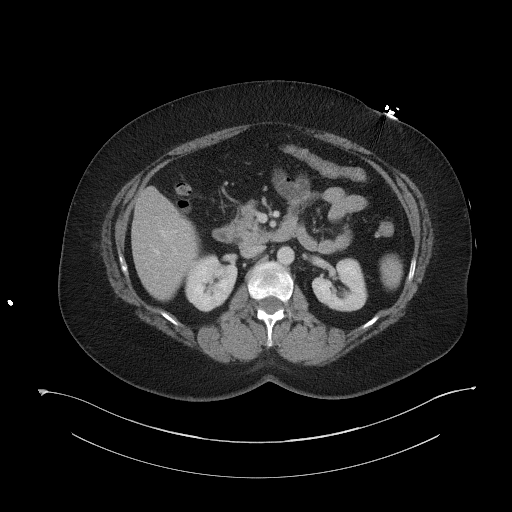
[im 77/97  soft-tissue]
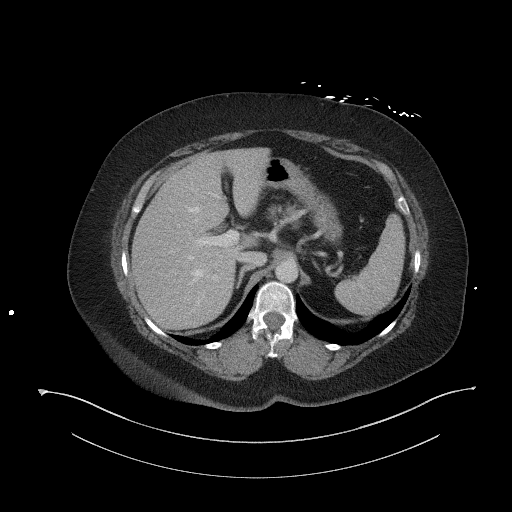
[im 82/97  soft-tissue]
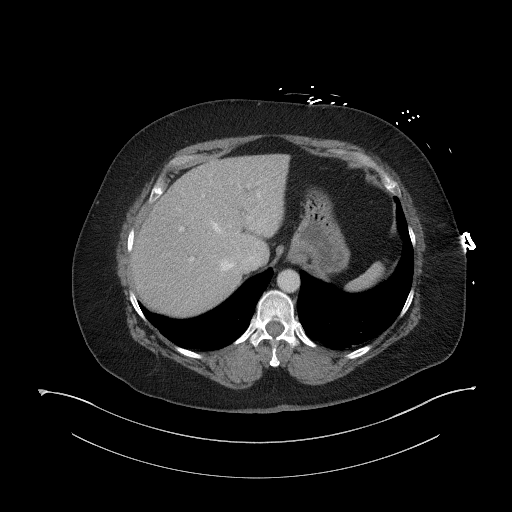
[im 92/97  soft-tissue]
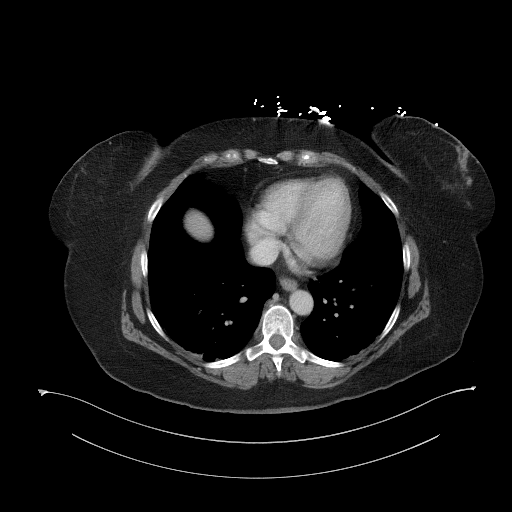

[Series 6: a/p w/ cor · coronal · 0.94mm/px · 3 of 163 slices shown]
[im 55/163  soft-tissue]
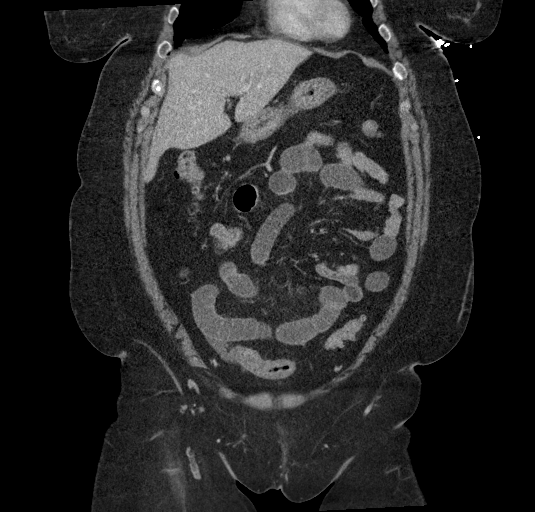
[im 73/163  soft-tissue]
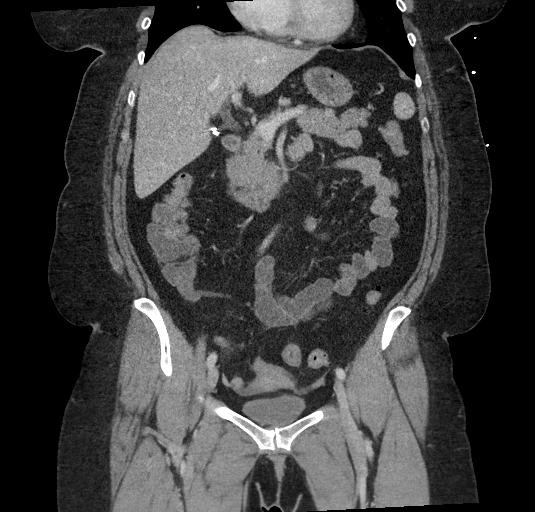
[im 91/163  soft-tissue]
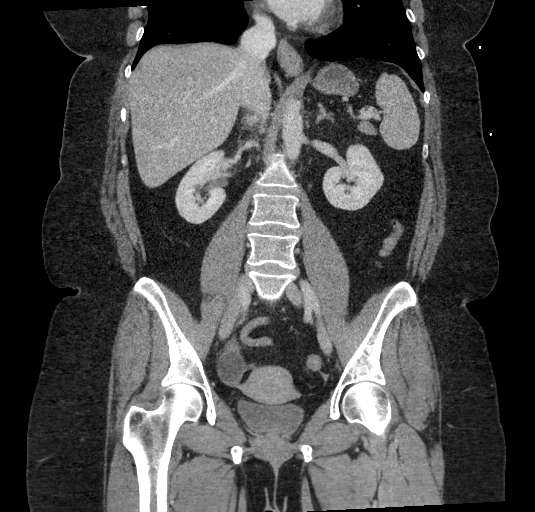

[16 of 46 positions shown; findings below may reference images not displayed]

FINDINGS: LOWER CHEST: There is no basilar pleural or apical pericardial
effusion.

HEPATOBILIARY: The hepatic contours and density are normal. There is
no intra- or extrahepatic biliary dilatation. Status post
cholecystectomy.

PANCREAS: The pancreatic parenchymal contours are normal and there
is no ductal dilatation. There is no peripancreatic fluid
collection.

SPLEEN: Normal.

ADRENALS/URINARY TRACT:

--Adrenal glands: Normal.

--Right kidney/ureter: No hydronephrosis, nephroureterolithiasis,
perinephric stranding or solid renal mass.

--Left kidney/ureter: No hydronephrosis, nephroureterolithiasis,
perinephric stranding or solid renal mass.

--Urinary bladder: Normal for degree of distention

STOMACH/BOWEL:

--Stomach/Duodenum: There is no hiatal hernia or other gastric
abnormality. The duodenal course and caliber are normal.

--Small bowel: No dilatation or inflammation.

--Colon: No focal abnormality.

--Appendix: Normal.

VASCULAR/LYMPHATIC: Normal course and caliber of the major abdominal
vessels. No abdominal or pelvic lymphadenopathy.

REPRODUCTIVE: Normal uterus and ovaries.

MUSCULOSKELETAL. No bony spinal canal stenosis or focal osseous
abnormality.

OTHER: Fat containing ventral abdominal hernia without inflammation.
IMPRESSION: 1. No acute abnormality of the abdomen or pelvis.
2. Fat containing ventral abdominal hernia.

## 2021-02-16 ENCOUNTER — Telehealth: Payer: Self-pay | Admitting: *Deleted

## 2021-02-16 NOTE — Telephone Encounter (Signed)
Per [employer], pt received new supplies on 01/20/21.

## 2021-02-20 ENCOUNTER — Telehealth: Payer: Self-pay | Admitting: Neurology

## 2021-02-20 ENCOUNTER — Ambulatory Visit: Payer: 59 | Admitting: Neurology

## 2021-02-20 NOTE — Telephone Encounter (Signed)
Pt cancelled appt due to having car trouble.

## 2021-02-20 NOTE — Patient Instructions (Addendum)
Please continue using your CPAP regularly. While your insurance requires that you use CPAP at least 4 hours each night on 70% of the nights, I recommend, that you not skip any nights and use it throughout the night if you can. Getting used to CPAP and staying with the treatment long term does take time and patience and discipline. Untreated obstructive sleep apnea when it is moderate to severe can have an adverse impact on cardiovascular health and raise her risk for heart disease, arrhythmias, hypertension, congestive heart failure, stroke and diabetes. Untreated obstructive sleep apnea causes sleep disruption, nonrestorative sleep, and sleep deprivation. This can have an impact on your day to day functioning and cause daytime sleepiness and impairment of cognitive function, memory loss, mood disturbance, and problems focussing. Using CPAP regularly can improve these symptoms.  Follow up in 1 year or sooner if needed  We discussed trying Melatonin XR if you feel this could be beneficial for you.

## 2021-02-20 NOTE — Progress Notes (Signed)
° ° °PATIENT: Tammie Gomez °DOB: 08/20/1960 ° °REASON FOR VISIT: follow up °HISTORY FROM: patient ° °Chief Complaint  °Patient presents with  ° Obstructive Sleep Apnea  °  Rm 10, alone. Here for 3 month CPAP f/u. Pt reports doing well on CPAP.   °  ° °HISTORY OF PRESENT ILLNESS: ° °02/22/21 ALL:  °Tammie Gomez is a 60 y.o. female here today for follow up for OSA on CPAP. She was seen in consult with Dr Athar 11/2020 and on CPAP therapy originally diagnosed in [place]. She was using CPAP nightly but having difficulty with 4 hour compliance. Her compliance below shows that she is wearing her CPAP for 77% of the time and 63% of the time she wears it for greater than four hours. Her apnea is well managed at AHI 1.9. °She endorses that she overall enjoys her CPAP and it has been helpful for her headaches. She does endorse that she had some sinus pain and congestion where she doesn't wear her machine. She recently switched to the nasal pillows and is happy with these. She does report occasionally having issues with staying asleep but is not interested in unnatural sleep aids.  ° ° ° ° °HISTORY: (copied from Dr Athar's previous note) ° °Dear Richard,  °  °I saw your patient, Tammie Gomez, upon your kind request in my sleep clinic today for initial consultation of her sleep disorder, in particular, evaluation of her prior diagnosis of obstructive sleep apnea.  The patient is unaccompanied today.  As you know, Tammie Gomez is a 60-year-old right-handed woman with an underlying medical history of arthritis, reflux disease, history of melanoma, history including genetics, prediabetes, inguinal hernia, and obesity, who was previously diagnosed with obstructive sleep apnea and placed on positive airway pressure treatment.  [place] in [place], and over 5 years ago. Prior test results are not available for my review today, she reports having had a home sleep test at the time.  She that her AHI was  41.5 at the time.  When she was first placed on AutoPap therapy, she was compliant with it, she greatly benefited from it.  Her usage has declined particularly recently because she needs new supplies and her mask does not fit well.  She had a tonsillectomy as a 3-year-old.  °Her weight has been stable.  He moved from [place] some 5 years ago.  She lives with her husband and her 20-year-old still lives at home.  She has an older daughter who has 1 child and patient raises her daughter's child, her 7 yo granddaughter. °Her bedtime is around 9 PM and rise time around 5 AM.  She drinks caffeine in the form of coffee, 1 or 2 cups daily, she is a non-smoker, drinks alcohol very occasionally.  She works from home. °I reviewed your office note from 11/11/2020.  Her Epworth sleepiness score is 10 out of 24, fatigue severity score is 39 out of 63. °I was able to review her PAP compliance data from 10/16/2020 through 11/14/2020, which is a total of 30 days, during which time she used her machine only 10 days with percent use days greater than 4 hours at 30% only, indicating suboptimal compliance with an average usage for days on treatment of 5 hours and 7 minutes, residual AHI at goal at 1.8/h, leak acceptable with a 95th percentile at 9.5 L/min with a pressure range of 7 cm to 20 cm with EPR.  95th percentile of pressure of 10.4 cm. In   In the month of September compliance was a little bit better with percent use days greater than 4 hours at 43%.     She had blood work through your office on 11/11/2020 I was able to review the results.  Vitamin D was below normal at 24.07.  She was advised to start an over-the-counter vitamin D supplement.  CBC with differential and platelets was benign, CMP showed benign findings, sodium 140, potassium 4.5, glucose 100, BUN 11, creatinine 0.78, alk phos 72, 6.0, in the prediabetes range.  TSH was below normal at 0.24.  Lipid panel showed total cholesterol of 213, triglycerides 114, HDL  62.6, LDL 127.   REVIEW OF SYSTEMS: Out of a complete 14 system review of symptoms, the patient complains only of the following symptoms, fatigue and all other reviewed systems are negative.  ESS: 10 FSS : 34  ALLERGIES: Allergies  Allergen Reactions   Etodolac Hives and Rash    HOME MEDICATIONS: Outpatient Medications Prior to Visit  Medication Sig Dispense Refill   celecoxib (CELEBREX) 100 MG capsule Take 1 capsule (100 mg total) by mouth 2 (two) times daily. 30 capsule 0   diclofenac Sodium (VOLTAREN) 1 % GEL Apply 4 g topically 4 (four) times daily. To affected joint. 100 g 11   fluticasone (FLONASE) 50 MCG/ACT nasal spray Place 1 spray daily as needed into the nose for allergies. For congestion     omeprazole (PRILOSEC) 10 MG capsule Take 1 capsule (10 mg total) by mouth 2 (two) times daily as needed (for heartburn). 60 capsule 1   No facility-administered medications prior to visit.    PAST MEDICAL HISTORY: Past Medical History:  Diagnosis Date   Arthritis    GERD (gastroesophageal reflux disease)    Melanoma ([place]) 1998   right leg, no chemo   Meningitis    Pre-diabetes    Sleep apnea    uses CPAP nightly   Uterine polyp     PAST SURGICAL HISTORY: Past Surgical History:  Procedure Laterality Date   cholecystectomy     CHOLECYSTECTOMY  2017   DILATATION & CURETTAGE/HYSTEROSCOPY WITH MYOSURE N/A 01/16/2017   Procedure: DILATATION & CURETTAGE/HYSTEROSCOPY WITH MYOSURE;  Surgeon: Sloan Leiter, MD;  Location: [facility];  Service: Gynecology;  Laterality: N/A;   DILATION AND CURETTAGE OF UTERUS     TONSILLECTOMY  1964    FAMILY HISTORY: Family History  Problem Relation Age of Onset   Alcohol abuse Mother    Heart attack Father    Stroke Paternal Grandfather    Sleep apnea Neg Hx     SOCIAL HISTORY: Social History   Socioeconomic History   Marital status: Married    Spouse name: [name]   Number of children: 2   Years of education: Not  on file   Highest education level: Not on file  Occupational History   Not on file  Tobacco Use   Smoking status: Never   Smokeless tobacco: Never  Vaping Use   Vaping Use: Never used  Substance and Sexual Activity   Alcohol use: Yes   Drug use: No   Sexual activity: Not Currently    Birth control/protection: None  Other Topics Concern   Not on file  Social History Narrative   Not on file   Social Determinants of Health   Financial Resource Strain: Not on file  Food Insecurity: Not on file  Transportation Needs: Not on file  Physical Activity: Not on file  Stress: Not on file  Social Connections: Not on file  Intimate Partner Violence: Not on file     PHYSICAL EXAM  Vitals:   02/22/21 0912  BP: 130/84  Pulse: 85  Weight: 220 lb (99.8 kg)  Height: 5' 4" (1.626 m)   Body mass index is 37.76 kg/m.  Generalized: Well developed, in no acute distress  Cardiology: normal rate and rhythm, no murmur noted Respiratory: clear to auscultation bilaterally  Neurological examination  Mentation: Alert oriented to time, place, history taking. Follows all commands speech and language fluent Cranial nerve II-XII: Pupils were equal round reactive to light. Extraocular movements were full, visual field were full  Motor: The motor testing reveals 5 over 5 strength of all 4 extremities. Good symmetric motor tone is noted throughout.  Gait and station: Gait is normal.    DIAGNOSTIC DATA (LABS, IMAGING, TESTING) - I reviewed patient records, labs, notes, testing and imaging myself where available.  No flowsheet data found.   Lab Results  Component Value Date   WBC 6.8 11/11/2020   HGB 13.6 11/11/2020   HCT 41.3 11/11/2020   MCV 89.5 11/11/2020   PLT 349.0 11/11/2020      Component Value Date/Time   NA 140 11/11/2020 1057   K 4.5 11/11/2020 1057   CL 103 11/11/2020 1057   CO2 30 11/11/2020 1057   GLUCOSE 100 (H) 11/11/2020 1057   BUN 11 11/11/2020 1057   CREATININE  0.78 11/11/2020 1057   CALCIUM 9.8 11/11/2020 1057   PROT 7.4 11/11/2020 1057   ALBUMIN 4.2 11/11/2020 1057   AST 14 11/11/2020 1057   ALT 15 11/11/2020 1057   ALKPHOS 72 11/11/2020 1057   BILITOT 0.4 11/11/2020 1057   GFRNONAA 54 (L) 05/26/2018 2320   GFRAA >60 05/26/2018 2320   Lab Results  Component Value Date   CHOL 213 (H) 11/11/2020   HDL 62.60 11/11/2020   LDLCALC 127 (H) 11/11/2020   TRIG 114.0 11/11/2020   CHOLHDL 3 11/11/2020   Lab Results  Component Value Date   HGBA1C 6.0 11/11/2020   Lab Results  Component Value Date   VITAMINB12 666 11/11/2020   Lab Results  Component Value Date   TSH 0.24 (L) 11/11/2020     ASSESSMENT AND PLAN 61 y.o. year old female  has a past medical history of Arthritis, GERD (gastroesophageal reflux disease), Melanoma ([place]) (1998), Meningitis, Pre-diabetes, Sleep apnea, and Uterine polyp. here with     ICD-10-CM   1. OSA on CPAP  G47.33    Z99.89        Jayleen Scaglione is doing well on CPAP therapy. Compliance report reveals acceptable daily but sub optimal 4 hour compliance. She does not always get 4 hours of sleep due to being primary caregiver for her disabled husband. She was encouraged to continue using CPAP nightly and for greater than 4 hours each night. We will update supply orders as indicated. We suggested using Melatonin XR for helping her stay asleep. Risks of untreated sleep apnea review and education materials provided. Healthy lifestyle habits encouraged. She will follow up in 1 year, sooner if needed. She verbalizes understanding and agreement with this plan.    No orders of the defined types were placed in this encounter.    No orders of the defined types were placed in this encounter.     Debbora Presto, FNP-C 02/22/2021, 11:59 AM [facility] [address] [place], [place] [address] [phone]

## 2021-02-22 ENCOUNTER — Ambulatory Visit: Payer: 59 | Admitting: Family Medicine

## 2021-02-22 ENCOUNTER — Encounter: Payer: Self-pay | Admitting: Family Medicine

## 2021-02-22 ENCOUNTER — Other Ambulatory Visit: Payer: Self-pay

## 2021-02-22 VITALS — BP 130/84 | HR 85 | Ht 64.0 in | Wt 220.0 lb

## 2021-02-22 DIAGNOSIS — G4733 Obstructive sleep apnea (adult) (pediatric): Secondary | ICD-10-CM

## 2021-02-22 DIAGNOSIS — Z9989 Dependence on other enabling machines and devices: Secondary | ICD-10-CM | POA: Diagnosis not present

## 2021-02-22 NOTE — Progress Notes (Signed)
CM sent to [facility] for new orders.

## 2021-05-12 ENCOUNTER — Ambulatory Visit: Payer: 59 | Admitting: Registered Nurse

## 2022-02-21 NOTE — Patient Instructions (Incomplete)

## 2022-02-21 NOTE — Progress Notes (Deleted)
PATIENT: Tammie Gomez DOB: [birth year]  REASON FOR VISIT: follow up HISTORY FROM: patient  No chief complaint on file.    HISTORY OF PRESENT ILLNESS:  02/21/22 ALL:  Tammie Gomez returns for follow up for OSA on CPAP.   Set up?  02/22/2021 ALL: Tammie Gomez is a 62 y.o. female here today for follow up for OSA on CPAP. She was seen in consult with Dr Rexene Alberts 11/2020 and on CPAP therapy originally diagnosed in [place]. She was using CPAP nightly but having difficulty with 4 hour compliance. Her compliance below shows that she is wearing her CPAP for 77% of the time and 63% of the time she wears it for greater than four hours. Her apnea is well managed at AHI 1.9. She endorses that she overall enjoys her CPAP and it has been helpful for her headaches. She does endorse that she had some sinus pain and congestion where she doesn't wear her machine. She recently switched to the nasal pillows and is happy with these. She does report occasionally having issues with staying asleep but is not interested in unnatural sleep aids.      HISTORY: (copied from Dr Guadelupe Sabin previous note)  Dear Tammie Gomez,    I saw your patient, Tammie Gomez, upon your kind request in my sleep clinic today for initial consultation of her sleep disorder, in particular, evaluation of her prior diagnosis of obstructive sleep apnea.  The patient is unaccompanied today.  As you know, Tammie Gomez is a 62 year old right-handed woman with an underlying medical history of arthritis, reflux disease, history of melanoma, history including genetics, prediabetes, inguinal hernia, and obesity, who was previously diagnosed with obstructive sleep apnea and placed on positive airway pressure treatment.  Testing was out[place]of[place]state in [place], and over 5 years ago. Prior test results are not available for my review today, she reports having had a home sleep test at the time.  She that her AHI was 41.5 at the time.  When she was first placed  on AutoPap therapy, she was compliant with it, she greatly benefited from it.  Her usage has declined particularly recently because she needs new supplies and her mask does not fit well.  She had a tonsillectomy as a 2-year-old.  Her weight has been stable.  He moved from [place] some 5 years ago.  She lives with her husband and her 60 year old still lives at home.  She has an older daughter who has 1 child and patient raises her daughter's child, her 18 yo granddaughter. Her bedtime is around 9 PM and rise time around 5 AM.  She drinks caffeine in the form of coffee, 1 or 2 cups daily, she is a non-smoker, drinks alcohol very occasionally.  She works from home. I reviewed your office note from 11/11/2020.  Her Epworth sleepiness score is 10 out of 24, fatigue severity score is 39 out of 63. I was able to review her PAP compliance data from 10/16/2020 through 11/14/2020, which is a total of 30 days, during which time she used her machine only 10 days with percent use days greater than 4 hours at 30% only, indicating suboptimal compliance with an average usage for days on treatment of 5 hours and 7 minutes, residual AHI at goal at 1.8/h, leak acceptable with a 95th percentile at 9.5 L/min with a pressure range of 7 cm to 20 cm with EPR.  95th percentile of pressure of 10.4 cm. In the month of September compliance was a little bit better  with percent use days greater than 4 hours at 43%.   She had blood work through your office on 11/11/2020 I was able to review the results.  Vitamin D was below normal at 24.07.  She was advised to start an over-the-counter vitamin D supplement.  CBC with differential and platelets was benign, CMP showed benign findings, sodium 140, potassium 4.5, glucose 100, BUN 11, creatinine 0.78, alk phos 72, 6.0, in the prediabetes range.  TSH was below normal at 0.24.  Lipid panel showed total cholesterol of 213, triglycerides 114, HDL 62.6, LDL 127.   REVIEW OF SYSTEMS: Out of a  complete 14 system review of symptoms, the patient complains only of the following symptoms, fatigue and all other reviewed systems are negative.  ESS: 10 FSS : 34  ALLERGIES: Allergies  Allergen Reactions   Etodolac Hives and Rash    HOME MEDICATIONS: Outpatient Medications Prior to Visit  Medication Sig Dispense Refill   celecoxib (CELEBREX) 100 MG capsule Take 1 capsule (100 mg total) by mouth 2 (two) times daily. 30 capsule 0   diclofenac Sodium (VOLTAREN) 1 % GEL Apply 4 g topically 4 (four) times daily. To affected joint. 100 g 11   fluticasone (FLONASE) 50 MCG/ACT nasal spray Place 1 spray daily as needed into the nose for allergies. For congestion     omeprazole (PRILOSEC) 10 MG capsule Take 1 capsule (10 mg total) by mouth 2 (two) times daily as needed (for heartburn). 60 capsule 1   No facility-administered medications prior to visit.    PAST MEDICAL HISTORY: Past Medical History:  Diagnosis Date   Arthritis    GERD (gastroesophageal reflux disease)    Melanoma ([place]) 1998   right leg, no chemo   Meningitis    Pre-diabetes    Sleep apnea    uses CPAP nightly   Uterine polyp     PAST SURGICAL HISTORY: Past Surgical History:  Procedure Laterality Date   cholecystectomy     CHOLECYSTECTOMY  2017   DILATATION & CURETTAGE/HYSTEROSCOPY WITH MYOSURE N/A 01/16/2017   Procedure: DILATATION & CURETTAGE/HYSTEROSCOPY WITH MYOSURE;  Surgeon: Sloan Leiter, MD;  Location: [facility];  Service: Gynecology;  Laterality: N/A;   DILATION AND CURETTAGE OF UTERUS     TONSILLECTOMY  1964    FAMILY HISTORY: Family History  Problem Relation Age of Onset   Alcohol abuse Mother    Heart attack Father    Stroke Paternal Grandfather    Sleep apnea Neg Hx     SOCIAL HISTORY: Social History   Socioeconomic History   Marital status: Married    Spouse name: [name]   Number of children: 2   Years of education: Not on file   Highest education level: Not on file   Occupational History   Not on file  Tobacco Use   Smoking status: Never   Smokeless tobacco: Never  Vaping Use   Vaping Use: Never used  Substance and Sexual Activity   Alcohol use: Yes   Drug use: No   Sexual activity: Not Currently    Birth control/protection: None  Other Topics Concern   Not on file  Social History Narrative   Not on file   Social Determinants of Health   Financial Resource Strain: Not on file  Food Insecurity: Not on file  Transportation Needs: Not on file  Physical Activity: Not on file  Stress: Not on file  Social Connections: Not on file  Intimate Partner Violence: Not on file  PHYSICAL EXAM  There were no vitals filed for this visit.  There is no height or weight on file to calculate BMI.  Generalized: Well developed, in no acute distress  Cardiology: normal rate and rhythm, no murmur noted Respiratory: clear to auscultation bilaterally  Neurological examination  Mentation: Alert oriented to time, place, history taking. Follows all commands speech and language fluent Cranial nerve II-XII: Pupils were equal round reactive to light. Extraocular movements were full, visual field were full  Motor: The motor testing reveals 5 over 5 strength of all 4 extremities. Good symmetric motor tone is noted throughout.  Gait and station: Gait is normal.    DIAGNOSTIC DATA (LABS, IMAGING, TESTING) - I reviewed patient records, labs, notes, testing and imaging myself where available.      No data to display           Lab Results  Component Value Date   WBC 6.8 11/11/2020   HGB 13.6 11/11/2020   HCT 41.3 11/11/2020   MCV 89.5 11/11/2020   PLT 349.0 11/11/2020      Component Value Date/Time   NA 140 11/11/2020 1057   K 4.5 11/11/2020 1057   CL 103 11/11/2020 1057   CO2 30 11/11/2020 1057   GLUCOSE 100 (H) 11/11/2020 1057   BUN 11 11/11/2020 1057   CREATININE 0.78 11/11/2020 1057   CALCIUM 9.8 11/11/2020 1057   PROT 7.4 11/11/2020  1057   ALBUMIN 4.2 11/11/2020 1057   AST 14 11/11/2020 1057   ALT 15 11/11/2020 1057   ALKPHOS 72 11/11/2020 1057   BILITOT 0.4 11/11/2020 1057   GFRNONAA 54 (L) 05/26/2018 2320   GFRAA >60 05/26/2018 2320   Lab Results  Component Value Date   CHOL 213 (H) 11/11/2020   HDL 62.60 11/11/2020   LDLCALC 127 (H) 11/11/2020   TRIG 114.0 11/11/2020   CHOLHDL 3 11/11/2020   Lab Results  Component Value Date   HGBA1C 6.0 11/11/2020   Lab Results  Component Value Date   VITAMINB12 666 11/11/2020   Lab Results  Component Value Date   TSH 0.24 (L) 11/11/2020     ASSESSMENT AND PLAN 62 y.o. year old female  has a past medical history of Arthritis, GERD (gastroesophageal reflux disease), Melanoma ([place]) (1998), Meningitis, Pre-diabetes, Sleep apnea, and Uterine polyp. here with   No diagnosis found.    Marlyne Totaro is doing well on CPAP therapy. Compliance report reveals acceptable daily but sub optimal 4 hour compliance. She does not always get 4 hours of sleep due to being primary caregiver for her disabled husband. She was encouraged to continue using CPAP nightly and for greater than 4 hours each night. We will update supply orders as indicated. We suggested using Melatonin XR for helping her stay asleep. Risks of untreated sleep apnea review and education materials provided. Healthy lifestyle habits encouraged. She will follow up in 1 year, sooner if needed. She verbalizes understanding and agreement with this plan.    No orders of the defined types were placed in this encounter.    No orders of the defined types were placed in this encounter.     Debbora Presto, FNP-C 02/21/2022, 9:29 AM [facility] [address] [place], [place] [address] [phone]

## 2022-02-22 ENCOUNTER — Ambulatory Visit: Payer: 59 | Admitting: Family Medicine

## 2022-08-15 NOTE — Progress Notes (Deleted)
PATIENT: Tammie Gomez DOB: 1960-07-06  REASON FOR VISIT: follow up HISTORY FROM: patient  No chief complaint on file.    HISTORY OF PRESENT ILLNESS:  08/15/22 ALL:  Tammie Gomez returns for follow up for OSA on CPAP.   02/22/2021 ALL:  Tammie Gomez is a 62 y.o. female here today for follow up for OSA on CPAP. She was seen in consult with Dr Frances Furbish 11/2020 and on CPAP therapy originally diagnosed in [place]. She was using CPAP nightly but having difficulty with 4 hour compliance. Her compliance below shows that she is wearing her CPAP for 77% of the time and 63% of the time she wears it for greater than four hours. Her apnea is well managed at AHI 1.9. She endorses that she overall enjoys her CPAP and it has been helpful for her headaches. She does endorse that she had some sinus pain and congestion where she doesn't wear her machine. She recently switched to the nasal pillows and is happy with these. She does report occasionally having issues with staying asleep but is not interested in unnatural sleep aids.      HISTORY: (copied from Dr Teofilo Pod previous note)  Dear Gerlene Burdock,    I saw your patient, Tammie Gomez, upon your kind request in my sleep clinic today for initial consultation of her sleep disorder, in particular, evaluation of her prior diagnosis of obstructive sleep apnea.  The patient is unaccompanied today.  As you know, Tammie Gomez is a 62 year old right-handed woman with an underlying medical history of arthritis, reflux disease, history of melanoma, history including genetics, prediabetes, inguinal hernia, and obesity, who was previously diagnosed with obstructive sleep apnea and placed on positive airway pressure treatment.  [place] in [place], and over 5 years ago. Prior test results are not available for my review today, she reports having had a home sleep test at the time.  She that her AHI was 41.5 at the time.  When she was first placed on AutoPap  therapy, she was compliant with it, she greatly benefited from it.  Her usage has declined particularly recently because she needs new supplies and her mask does not fit well.  She had a tonsillectomy as a 77-year-old.  Her weight has been stable.  He moved from [place] some 5 years ago.  She lives with her husband and her 13 year old still lives at home.  She has an older daughter who has 1 child and patient raises her daughter's child, her 78 yo granddaughter. Her bedtime is around 9 PM and rise time around 5 AM.  She drinks caffeine in the form of coffee, 1 or 2 cups daily, she is a non-smoker, drinks alcohol very occasionally.  She works from home. I reviewed your office note from 11/11/2020.  Her Epworth sleepiness score is 10 out of 24, fatigue severity score is 39 out of 63. I was able to review her PAP compliance data from 10/16/2020 through 11/14/2020, which is a total of 30 days, during which time she used her machine only 10 days with percent use days greater than 4 hours at 30% only, indicating suboptimal compliance with an average usage for days on treatment of 5 hours and 7 minutes, residual AHI at goal at 1.8/h, leak acceptable with a 95th percentile at 9.5 L/min with a pressure range of 7 cm to 20 cm with EPR.  95th percentile of pressure of 10.4 cm. In the month of September compliance was a little bit better with percent  use days greater than 4 hours at 43%.     She had blood work through your office on 11/11/2020 I was able to review the results.  Vitamin D was below normal at 24.07.  She was advised to start an over-the-counter vitamin D supplement.  CBC with differential and platelets was benign, CMP showed benign findings, sodium 140, potassium 4.5, glucose 100, BUN 11, creatinine 0.78, alk phos 72, 6.0, in the prediabetes range.  TSH was below normal at 0.24.  Lipid panel showed total cholesterol of 213, triglycerides 114, HDL 62.6, LDL 127.   REVIEW OF SYSTEMS: Out of a complete 14  system review of symptoms, the patient complains only of the following symptoms, fatigue and all other reviewed systems are negative.  ESS: 10 FSS : 34  ALLERGIES: Allergies  Allergen Reactions   Etodolac Hives and Rash    HOME MEDICATIONS: Outpatient Medications Prior to Visit  Medication Sig Dispense Refill   celecoxib (CELEBREX) 100 MG capsule Take 1 capsule (100 mg total) by mouth 2 (two) times daily. 30 capsule 0   diclofenac Sodium (VOLTAREN) 1 % GEL Apply 4 g topically 4 (four) times daily. To affected joint. 100 g 11   fluticasone (FLONASE) 50 MCG/ACT nasal spray Place 1 spray daily as needed into the nose for allergies. For congestion     omeprazole (PRILOSEC) 10 MG capsule Take 1 capsule (10 mg total) by mouth 2 (two) times daily as needed (for heartburn). 60 capsule 1   No facility-administered medications prior to visit.    PAST MEDICAL HISTORY: Past Medical History:  Diagnosis Date   Arthritis    GERD (gastroesophageal reflux disease)    Melanoma (HCC) 1998   right leg, no chemo   Meningitis    Pre-diabetes    Sleep apnea    uses CPAP nightly   Uterine polyp     PAST SURGICAL HISTORY: Past Surgical History:  Procedure Laterality Date   cholecystectomy     CHOLECYSTECTOMY  2017   DILATATION & CURETTAGE/HYSTEROSCOPY WITH MYOSURE N/A 01/16/2017   Procedure: DILATATION & CURETTAGE/HYSTEROSCOPY WITH MYOSURE;  Surgeon: Conan Bowens, MD;  Location: [facility];  Service: Gynecology;  Laterality: N/A;   DILATION AND CURETTAGE OF UTERUS     TONSILLECTOMY  1964    FAMILY HISTORY: Family History  Problem Relation Age of Onset   Alcohol abuse Mother    Heart attack Father    Stroke Paternal Grandfather    Sleep apnea Neg Hx     SOCIAL HISTORY: Social History   Socioeconomic History   Marital status: Married    Spouse name: Bernette Mayers   Number of children: 2   Years of education: Not on file   Highest education level: Not on file   Occupational History   Not on file  Tobacco Use   Smoking status: Never   Smokeless tobacco: Never  Vaping Use   Vaping status: Never Used  Substance and Sexual Activity   Alcohol use: Yes   Drug use: No   Sexual activity: Not Currently    Birth control/protection: None  Other Topics Concern   Not on file  Social History Narrative   Not on file   Social Determinants of Health   Financial Resource Strain: Not on file  Food Insecurity: Not on file  Transportation Needs: Not on file  Physical Activity: Not on file  Stress: Not on file  Social Connections: Not on file  Intimate Partner Violence: Not on file  PHYSICAL EXAM  There were no vitals filed for this visit.  There is no height or weight on file to calculate BMI.  Generalized: Well developed, in no acute distress  Cardiology: normal rate and rhythm, no murmur noted Respiratory: clear to auscultation bilaterally  Neurological examination  Mentation: Alert oriented to time, place, history taking. Follows all commands speech and language fluent Cranial nerve II-XII: Pupils were equal round reactive to light. Extraocular movements were full, visual field were full  Motor: The motor testing reveals 5 over 5 strength of all 4 extremities. Good symmetric motor tone is noted throughout.  Gait and station: Gait is normal.    DIAGNOSTIC DATA (LABS, IMAGING, TESTING) - I reviewed patient records, labs, notes, testing and imaging myself where available.      No data to display           Lab Results  Component Value Date   WBC 6.8 11/11/2020   HGB 13.6 11/11/2020   HCT 41.3 11/11/2020   MCV 89.5 11/11/2020   PLT 349.0 11/11/2020      Component Value Date/Time   NA 140 11/11/2020 1057   K 4.5 11/11/2020 1057   CL 103 11/11/2020 1057   CO2 30 11/11/2020 1057   GLUCOSE 100 (H) 11/11/2020 1057   BUN 11 11/11/2020 1057   CREATININE 0.78 11/11/2020 1057   CALCIUM 9.8 11/11/2020 1057   PROT 7.4 11/11/2020  1057   ALBUMIN 4.2 11/11/2020 1057   AST 14 11/11/2020 1057   ALT 15 11/11/2020 1057   ALKPHOS 72 11/11/2020 1057   BILITOT 0.4 11/11/2020 1057   GFRNONAA 54 (L) 05/26/2018 2320   GFRAA >60 05/26/2018 2320   Lab Results  Component Value Date   CHOL 213 (H) 11/11/2020   HDL 62.60 11/11/2020   LDLCALC 127 (H) 11/11/2020   TRIG 114.0 11/11/2020   CHOLHDL 3 11/11/2020   Lab Results  Component Value Date   HGBA1C 6.0 11/11/2020   Lab Results  Component Value Date   VITAMINB12 666 11/11/2020   Lab Results  Component Value Date   TSH 0.24 (L) 11/11/2020     ASSESSMENT AND PLAN 62 y.o. year old female  has a past medical history of Arthritis, GERD (gastroesophageal reflux disease), Melanoma (HCC) (1998), Meningitis, Pre-diabetes, Sleep apnea, and Uterine polyp. here with   No diagnosis found.    Tammie Gomez is doing well on CPAP therapy. Compliance report reveals acceptable daily but sub optimal 4 hour compliance. She does not always get 4 hours of sleep due to being primary caregiver for her disabled husband. She was encouraged to continue using CPAP nightly and for greater than 4 hours each night. We will update supply orders as indicated. We suggested using Melatonin XR for helping her stay asleep. Risks of untreated sleep apnea review and education materials provided. Healthy lifestyle habits encouraged. She will follow up in 1 year, sooner if needed. She verbalizes understanding and agreement with this plan.    No orders of the defined types were placed in this encounter.    No orders of the defined types were placed in this encounter.     Shawnie Dapper, FNP-C 08/15/2022, 4:43 PM [facility] [address] [place], [place] [address] [phone]

## 2022-08-16 ENCOUNTER — Telehealth: Payer: Self-pay

## 2022-08-20 ENCOUNTER — Ambulatory Visit: Payer: 59 | Admitting: Family Medicine

## 2022-08-20 ENCOUNTER — Encounter: Payer: Self-pay | Admitting: Family Medicine

## 2022-08-20 DIAGNOSIS — G4733 Obstructive sleep apnea (adult) (pediatric): Secondary | ICD-10-CM
